# Patient Record
Sex: Female | Born: 1965 | Race: White | Hispanic: No | Marital: Married | Smoking: Never smoker
Health system: Southern US, Community
[De-identification: ages and names within clinical notes are randomized; demographics above are authoritative.]

## PROBLEM LIST (undated history)

## (undated) HISTORY — PX: NOSE SURGERY: SHX723

## (undated) HISTORY — PX: GANGLION CYST EXCISION: SHX1691

## (undated) HISTORY — PX: TUBAL LIGATION: SHX77

---

## 1999-01-18 ENCOUNTER — Inpatient Hospital Stay (HOSPITAL_COMMUNITY): Admission: AD | Admit: 1999-01-18 | Discharge: 1999-01-21 | Payer: Self-pay | Admitting: Obstetrics and Gynecology

## 1999-02-23 ENCOUNTER — Other Ambulatory Visit: Admission: RE | Admit: 1999-02-23 | Discharge: 1999-02-23 | Payer: Self-pay | Admitting: Obstetrics and Gynecology

## 2000-04-03 ENCOUNTER — Other Ambulatory Visit: Admission: RE | Admit: 2000-04-03 | Discharge: 2000-04-03 | Payer: Self-pay | Admitting: Obstetrics and Gynecology

## 2002-05-18 ENCOUNTER — Other Ambulatory Visit: Admission: RE | Admit: 2002-05-18 | Discharge: 2002-05-18 | Payer: Self-pay | Admitting: Obstetrics and Gynecology

## 2003-05-21 ENCOUNTER — Other Ambulatory Visit: Admission: RE | Admit: 2003-05-21 | Discharge: 2003-05-21 | Payer: Self-pay | Admitting: Obstetrics and Gynecology

## 2004-05-17 ENCOUNTER — Ambulatory Visit: Payer: Self-pay | Admitting: Family Medicine

## 2004-10-12 ENCOUNTER — Ambulatory Visit: Payer: Self-pay | Admitting: Family Medicine

## 2005-05-15 ENCOUNTER — Other Ambulatory Visit: Admission: RE | Admit: 2005-05-15 | Discharge: 2005-05-15 | Payer: Self-pay | Admitting: Obstetrics and Gynecology

## 2005-08-13 ENCOUNTER — Ambulatory Visit: Payer: Self-pay | Admitting: Family Medicine

## 2006-08-24 ENCOUNTER — Emergency Department: Payer: Self-pay | Admitting: Emergency Medicine

## 2006-08-28 ENCOUNTER — Ambulatory Visit: Payer: Self-pay | Admitting: Otolaryngology

## 2008-11-26 ENCOUNTER — Ambulatory Visit: Payer: Self-pay | Admitting: General Practice

## 2008-12-02 ENCOUNTER — Ambulatory Visit: Payer: Self-pay | Admitting: General Practice

## 2009-05-27 ENCOUNTER — Ambulatory Visit: Payer: Self-pay | Admitting: General Practice

## 2012-02-13 ENCOUNTER — Ambulatory Visit: Payer: Self-pay | Admitting: Internal Medicine

## 2014-07-13 ENCOUNTER — Ambulatory Visit: Payer: Self-pay | Admitting: Podiatry

## 2014-09-30 ENCOUNTER — Emergency Department: Payer: Self-pay | Admitting: Emergency Medicine

## 2017-05-27 ENCOUNTER — Emergency Department
Admission: EM | Admit: 2017-05-27 | Discharge: 2017-05-27 | Disposition: A | Discharge status: Home / Self Care | Payer: BLUE CROSS/BLUE SHIELD | Attending: Emergency Medicine | Admitting: Emergency Medicine

## 2017-05-27 ENCOUNTER — Other Ambulatory Visit: Payer: Self-pay

## 2017-05-27 DIAGNOSIS — Y929 Unspecified place or not applicable: Secondary | ICD-10-CM | POA: Insufficient documentation

## 2017-05-27 DIAGNOSIS — Y999 Unspecified external cause status: Secondary | ICD-10-CM | POA: Diagnosis not present

## 2017-05-27 DIAGNOSIS — S01112A Laceration without foreign body of left eyelid and periocular area, initial encounter: Secondary | ICD-10-CM | POA: Insufficient documentation

## 2017-05-27 DIAGNOSIS — W208XXA Other cause of strike by thrown, projected or falling object, initial encounter: Secondary | ICD-10-CM | POA: Insufficient documentation

## 2017-05-27 DIAGNOSIS — Y939 Activity, unspecified: Secondary | ICD-10-CM | POA: Diagnosis not present

## 2017-05-27 NOTE — ED Notes (Signed)

## 2017-05-27 NOTE — Discharge Instructions (Signed)
Keep wound area clean and dry. Allow steri-strips and glue to fall off and wear away.   Take ibuprofen for pain.

## 2017-05-27 NOTE — ED Triage Notes (Signed)
Pt states she was pulling up some fencing while wearing her glasses. States fencing hit pt face around L eye. Believes that glasses cut L eyelid. abrasion noted to L side of nose with straight cut inside eye. Laceration with controlled bleeding noted to L eyelid. Tetanus in 10 years.

## 2017-05-27 NOTE — ED Provider Notes (Signed)
[facility] Emergency Department Provider Note   ____________________________________________   I have reviewed the triage vital signs and the nursing notes.   HISTORY  Chief Complaint Laceration    HPI Krista NippleRobin A Cocking is a 51 y.o. female presents emergency department with small laceration in the left eyebrow she sustained when a piece of fencing kicked back hitting her eyeglasses causing a small laceration.  Patient managed hemorrhage until arriving in the emergency department.  Laceration and injury did not affect her vision. Patient denies fever, chills, headache, vision changes, chest pain, chest tightness, shortness of breath, abdominal pain, nausea and vomiting.  History reviewed. No pertinent past medical history.  There are no active problems to display for this patient.   Past Surgical History:  Procedure Laterality Date  . GANGLION CYST EXCISION    . NOSE SURGERY    . TUBAL LIGATION      Prior to Admission medications   Not on File    Allergies Patient has no known allergies.  History reviewed. No pertinent family history.  Social History Social History   Tobacco Use  . Smoking status: Not on file  Substance Use Topics  . Alcohol use: Not on file  . Drug use: Not on file    Review of Systems Constitutional: Negative for fever/chills Eyes: No visual changes.   Cardiovascular: Denies chest pain. Respiratory:  Denies shortness of breath. Skin: Negative for rash.  Small laceration in the left eyebrow. Neurological: Negative for headaches.  Negative focal weakness or numbness. Negative for loss of consciousness. Able to ambulate. ____________________________________________   PHYSICAL EXAM:  VITAL SIGNS: ED Triage Vitals  Enc Vitals Group     BP 05/27/17 1133 114/78     Pulse Rate 05/27/17 1133 79     Resp 05/27/17 1133 18     Temp 05/27/17 1133 97.9 F (36.6 C)     Temp Source 05/27/17 1133 Oral     SpO2 05/27/17  1133 100 %     Weight 05/27/17 1135 162 lb (73.5 kg)     Height 05/27/17 1135 5\' 6"  (1.676 m)     Head Circumference --      Peak Flow --      Pain Score 05/27/17 1134 5     Pain Loc --      Pain Edu? --      Excl. in GC? --     Constitutional: Alert and oriented. Well appearing and in no acute distress.  Eyes: Conjunctivae are normal. PERRL. EOMI Intact visual acuity Head: Normocephalic and atraumatic. Cardiovascular: Normal rate, regular rhythm.  Good peripheral circulation. Respiratory: Normal respiratory effort without tachypnea or retractions. Neurologic: Normal speech and language.  Skin:  Skin is warm, dry and intact. No rash noted. 1.25 cm superficial laceration along the left eyebrow. Psychiatric: Mood and affect are normal. Speech and behavior are normal. Patient exhibits appropriate insight and judgement.  ____________________________________________   LABS (all labs ordered are listed, but only abnormal results are displayed)  Labs Reviewed - No data to display ____________________________________________  EKG None ____________________________________________  RADIOLOGY None ____________________________________________   PROCEDURES  Procedure(s) performed: LACERATION REPAIR Performed by: Clois Comberraci M Dandy Lazaro Authorized by: Clois Comberraci M Jemmie Ledgerwood Consent: Verbal consent obtained. Risks and benefits: risks, benefits and alternatives were discussed Consent given by: patient Patient identity confirmed: provided demographic data Prepped and Draped in normal sterile fashion Wound explored  Laceration Location: left eybrow  Laceration Length: 1.25 cm  No Foreign Bodies seen or  palpated Irrigation method: syringe Amount of cleaning: standard  Skin closure: Dermabond with steri-strips    Critical Care performed: no ____________________________________________   INITIAL IMPRESSION / ASSESSMENT AND PLAN / ED COURSE  Pertinent labs & imaging results that were  available during my care of the patient were reviewed by me and considered in my medical decision making (see chart for details).   Patient presents to emergency department with facial laceration along the left eyebrow she sustained earlier today.  History, physical exam findings consistent with isolated superficial laceration along the left eyebrow without any other injuries.  Laceration required closure via Dermabond and Steri-Strips.  See procedure note above, no complications noted.  Advised to monitor wound area for signs of healing and if she noted any infections to follow-up with primary care or return to emergency department. Patient informed of clinical course, understand medical decision-making process, and agree with plan.  ____________________________________________   FINAL CLINICAL IMPRESSION(S) / ED DIAGNOSES  Final diagnoses:  Laceration of left eyebrow, initial encounter       NEW MEDICATIONS STARTED DURING THIS VISIT:  This SmartLink is deprecated. Use AVSMEDLIST instead to display the medication list for a patient.   Note:  This document was prepared using Dragon voice recognition software and may include unintentional dictation errors.    Clois ComberLittle, Larri Brewton M, PA-C 05/27/17 1657    Phineas SemenGoodman, Gray[place], MD 05/28/17 [phone]

## 2020-12-30 ENCOUNTER — Other Ambulatory Visit: Payer: Self-pay | Admitting: Surgery

## 2021-01-03 ENCOUNTER — Other Ambulatory Visit: Payer: Self-pay

## 2021-01-03 ENCOUNTER — Encounter
Admission: RE | Admit: 2021-01-03 | Discharge: 2021-01-03 | Disposition: A | Discharge status: Home / Self Care | Payer: BC Managed Care – PPO | Source: Ambulatory Visit | Attending: Surgery | Admitting: Surgery

## 2021-01-03 MED ORDER — ORAL CARE MOUTH RINSE: 15.0000 mL | Freq: Once | OROMUCOSAL | Status: AC

## 2021-01-03 MED ORDER — FAMOTIDINE 20 MG PO TABS: 20.0000 mg | ORAL_TABLET | Freq: Once | ORAL | Status: AC

## 2021-01-03 MED ORDER — CEFAZOLIN SODIUM-DEXTROSE 2-4 GM/100ML-% IV SOLN: 2.0000 g | INTRAVENOUS | Status: DC

## 2021-01-03 MED ORDER — CHLORHEXIDINE GLUCONATE 0.12 % MT SOLN: 15.0000 mL | Freq: Once | OROMUCOSAL | Status: AC

## 2021-01-03 MED ORDER — LACTATED RINGERS IV SOLN: INTRAVENOUS | Status: DC

## 2021-01-03 NOTE — Patient Instructions (Addendum)
Your procedure is scheduled on: 01/04/21 Report to DAY SURGERY DEPARTMENT LOCATED ON 2ND FLOOR MEDICAL MALL ENTRANCE. To find out your arrival time you may call [phone] between 1PM - 3PM on 01/03/21.   Remember: Instructions that are not followed completely may result in serious medical risk, up to and including death, or upon the discretion of your surgeon and anesthesiologist your surgery may need to be rescheduled.     _X__ 1. Do not eat food after midnight the night before your procedure.                 No gum chewing or hard candies. You may drink clear liquids up to 2 hours                 before you are scheduled to arrive for your surgery- DO not drink clear                 liquids within 2 hours of the start of your surgery.                 Clear Liquids include:  water, apple juice without pulp, clear carbohydrate                 drink such as Clearfast or Gatorade, Black Coffee or Tea (Do not add                 anything to coffee or tea). Diabetics water only  __X__2.  On the morning of surgery brush your teeth with toothpaste and water, you                 may rinse your mouth with mouthwash if you wish.  Do not swallow any              toothpaste of mouthwash.     _X__ 3.  No Alcohol for 24 hours before or after surgery.   _X__ 4.  Do Not Smoke or use e-cigarettes For 24 Hours Prior to Your Surgery.                 Do not use any chewable tobacco products for at least 6 hours prior to                 surgery.  ____  5.  Bring all medications with you on the day of surgery if instructed.   __X__  6.  Notify your doctor if there is any change in your medical condition      (cold, fever, infections).     Do not wear jewelry, make-up, hairpins, clips or nail polish. Do not wear lotions, powders, or perfumes. No Deodorant Do not shave 48 hours prior to surgery. Men may shave face and neck. Do not bring valuables to the hospital.    [facility] is not responsible for any  belongings or valuables.  Contacts, dentures/partials or body piercings may not be worn into surgery. Bring a case for your contacts, glasses or hearing aids, a denture cup will be supplied. Leave your suitcase in the car. After surgery it may be brought to your room. For patients admitted to the hospital, discharge time is determined by your treatment team.   Patients discharged the day of surgery will not be allowed to drive home.   Please read over the following fact sheets that you were given:     __X__ Take these medicines the morning of surgery with A SIP OF WATER:  1. celebrex  2.   3.   4.  5.  6.  ____ Fleet Enema (as directed)   ____ Use CHG Soap/SAGE wipes as directed  ____ Use inhalers on the day of surgery  ____ Stop metformin/Janumet/Farxiga 2 days prior to surgery    ____ Take 1/2 of usual insulin dose the night before surgery. No insulin the morning          of surgery.   ____ Stop Blood Thinners Coumadin/Plavix/Xarelto/Pleta/Pradaxa/Eliquis/Effient/Aspirin  on   Or contact your Surgeon, Cardiologist or Medical Doctor regarding  ability to stop your blood thinners  __X__ Stop Anti-inflammatories 7 days before surgery such as Advil, Ibuprofen, Motrin,  BC or Goodies Powder, Naprosyn, Naproxen, Aleve, Aspirin    __X__ Stop all herbal supplements, fish oil or vitamin E until after surgery.  Hold the collagen.  ____ Bring C-Pap to the hospital.    Wear something loose and comfortable

## 2021-01-04 ENCOUNTER — Ambulatory Visit: Payer: BC Managed Care – PPO | Admitting: Certified Registered"

## 2021-01-04 ENCOUNTER — Encounter: Payer: Self-pay | Admitting: Surgery

## 2021-01-04 ENCOUNTER — Encounter
Admission: RE | Disposition: A | Discharge status: Home / Self Care | Payer: Self-pay | Source: Home / Self Care | Attending: Surgery

## 2021-01-04 ENCOUNTER — Ambulatory Visit
Admission: RE | Admit: 2021-01-04 | Discharge: 2021-01-04 | Disposition: A | Discharge status: Home / Self Care | Payer: BC Managed Care – PPO | Attending: Surgery | Admitting: Surgery

## 2021-01-04 ENCOUNTER — Other Ambulatory Visit: Payer: Self-pay

## 2021-01-04 ENCOUNTER — Ambulatory Visit: Payer: BC Managed Care – PPO | Admitting: Urgent Care

## 2021-01-04 DIAGNOSIS — M7502 Adhesive capsulitis of left shoulder: Secondary | ICD-10-CM | POA: Diagnosis present

## 2021-01-04 HISTORY — PX: CLOSED MANIPULATION SHOULDER WITH STERIOD INJECTION: SHX5611

## 2021-01-04 SURGERY — CLOSED MANIPULATION SHOULDER WITH STEROID INJECTION
Anesthesia: General | Site: Shoulder | Laterality: Left

## 2021-01-04 MED ORDER — BUPIVACAINE HCL (PF) 0.25 % IJ SOLN
INTRAMUSCULAR | Status: AC
  Filled 2021-01-04: qty 30

## 2021-01-04 MED ORDER — ONDANSETRON HCL 4 MG/2ML IJ SOLN
INTRAMUSCULAR | Status: DC | PRN
  Administered 2021-01-04: 4 mg via INTRAVENOUS

## 2021-01-04 MED ORDER — ONDANSETRON HCL 4 MG PO TABS: 4.0000 mg | ORAL_TABLET | Freq: Four times a day (QID) | ORAL | Status: DC | PRN

## 2021-01-04 MED ORDER — CEFAZOLIN SODIUM-DEXTROSE 2-4 GM/100ML-% IV SOLN
INTRAVENOUS | Status: AC
  Filled 2021-01-04: qty 100

## 2021-01-04 MED ORDER — PROPOFOL 10 MG/ML IV BOLUS
INTRAVENOUS | Status: AC
  Filled 2021-01-04: qty 20

## 2021-01-04 MED ORDER — PENTAFLUOROPROP-TETRAFLUOROETH EX AERO
INHALATION_SPRAY | CUTANEOUS | Status: AC
  Filled 2021-01-04: qty 30

## 2021-01-04 MED ORDER — TRAMADOL HCL 50 MG PO TABS
ORAL_TABLET | ORAL | Status: AC
  Administered 2021-01-04: 50 mg via ORAL
  Filled 2021-01-04: qty 1

## 2021-01-04 MED ORDER — BUPIVACAINE HCL (PF) 0.25 % IJ SOLN
INTRAMUSCULAR | Status: DC | PRN
  Administered 2021-01-04: 9 mL

## 2021-01-04 MED ORDER — METOCLOPRAMIDE HCL 10 MG PO TABS: 5.0000 mg | ORAL_TABLET | Freq: Three times a day (TID) | ORAL | Status: DC | PRN

## 2021-01-04 MED ORDER — TRIAMCINOLONE ACETONIDE 40 MG/ML IJ SUSP
INTRAMUSCULAR | Status: DC | PRN
  Administered 2021-01-04: 10 mL via SUBCUTANEOUS

## 2021-01-04 MED ORDER — CHLORHEXIDINE GLUCONATE 0.12 % MT SOLN
OROMUCOSAL | Status: AC
  Administered 2021-01-04: 15 mL via OROMUCOSAL
  Filled 2021-01-04: qty 15

## 2021-01-04 MED ORDER — ONDANSETRON HCL 4 MG/2ML IJ SOLN: 4.0000 mg | Freq: Four times a day (QID) | INTRAMUSCULAR | Status: DC | PRN

## 2021-01-04 MED ORDER — FENTANYL CITRATE (PF) 100 MCG/2ML IJ SOLN
INTRAMUSCULAR | Status: DC | PRN
  Administered 2021-01-04 (×2): 50 ug via INTRAVENOUS

## 2021-01-04 MED ORDER — LIDOCAINE HCL (CARDIAC) PF 100 MG/5ML IV SOSY
PREFILLED_SYRINGE | INTRAVENOUS | Status: DC | PRN
  Administered 2021-01-04: 50 mg via INTRAVENOUS

## 2021-01-04 MED ORDER — FENTANYL CITRATE (PF) 100 MCG/2ML IJ SOLN
25.0000 ug | INTRAMUSCULAR | Status: DC | PRN
  Administered 2021-01-04: 25 ug via INTRAVENOUS

## 2021-01-04 MED ORDER — FAMOTIDINE 20 MG PO TABS
ORAL_TABLET | ORAL | Status: AC
  Administered 2021-01-04: 20 mg via ORAL
  Filled 2021-01-04: qty 1

## 2021-01-04 MED ORDER — TRAMADOL HCL 50 MG PO TABS: 50.0000 mg | ORAL_TABLET | Freq: Four times a day (QID) | ORAL | 0 refills | Status: AC | PRN

## 2021-01-04 MED ORDER — PROPOFOL 10 MG/ML IV BOLUS
INTRAVENOUS | Status: DC | PRN
  Administered 2021-01-04: 100 mg via INTRAVENOUS

## 2021-01-04 MED ORDER — FENTANYL CITRATE (PF) 100 MCG/2ML IJ SOLN
INTRAMUSCULAR | Status: AC
  Filled 2021-01-04: qty 2

## 2021-01-04 MED ORDER — FENTANYL CITRATE (PF) 100 MCG/2ML IJ SOLN
INTRAMUSCULAR | Status: AC
  Administered 2021-01-04: 25 ug via INTRAVENOUS
  Filled 2021-01-04: qty 2

## 2021-01-04 MED ORDER — MIDAZOLAM HCL 2 MG/2ML IJ SOLN
INTRAMUSCULAR | Status: AC
  Filled 2021-01-04: qty 2

## 2021-01-04 MED ORDER — POTASSIUM CHLORIDE IN NACL 20-0.9 MEQ/L-% IV SOLN
INTRAVENOUS | Status: DC
  Filled 2021-01-04 (×3): qty 1000

## 2021-01-04 MED ORDER — MIDAZOLAM HCL 2 MG/2ML IJ SOLN
INTRAMUSCULAR | Status: DC | PRN
  Administered 2021-01-04: 2 mg via INTRAVENOUS

## 2021-01-04 MED ORDER — METOCLOPRAMIDE HCL 5 MG/ML IJ SOLN: 5.0000 mg | Freq: Three times a day (TID) | INTRAMUSCULAR | Status: DC | PRN

## 2021-01-04 MED ORDER — TRIAMCINOLONE ACETONIDE 40 MG/ML IJ SUSP
INTRAMUSCULAR | Status: AC
  Filled 2021-01-04: qty 1

## 2021-01-04 MED ORDER — TRAMADOL HCL 50 MG PO TABS: 50.0000 mg | ORAL_TABLET | Freq: Four times a day (QID) | ORAL | Status: DC | PRN

## 2021-01-04 SURGICAL SUPPLY — 50 items
APL PRP STRL LF DISP 70% ISPRP (MISCELLANEOUS) ×1
BIT DRILL JUGRKNT W/NDL BIT2.9 (DRILL) IMPLANT
BLADE FULL RADIUS 3.5 (BLADE) ×2 IMPLANT
BNDG ADH 1X3 SHEER STRL LF (GAUZE/BANDAGES/DRESSINGS) ×2 IMPLANT
BNDG ADH THN 3X1 STRL LF (GAUZE/BANDAGES/DRESSINGS) ×1
BUR ACROMIONIZER 4.0 (BURR) ×2 IMPLANT
CANNULA SHAVER 8MMX76MM (CANNULA) ×2 IMPLANT
CHLORAPREP W/TINT 26 (MISCELLANEOUS) ×2 IMPLANT
COVER MAYO STAND REUSABLE (DRAPES) ×2 IMPLANT
COVER WAND RF STERILE (DRAPES) ×2 IMPLANT
DRAPE IMP U-DRAPE 54X76 (DRAPES) ×4 IMPLANT
DRILL JUGGERKNOT W/NDL BIT 2.9 (DRILL)
ELECT REM PT RETURN 9FT ADLT (ELECTROSURGICAL) ×2
ELECTRODE REM PT RTRN 9FT ADLT (ELECTROSURGICAL) ×1 IMPLANT
GAUZE SPONGE 4X4 12PLY STRL (GAUZE/BANDAGES/DRESSINGS) ×2 IMPLANT
GAUZE XEROFORM 1X8 LF (GAUZE/BANDAGES/DRESSINGS) ×2 IMPLANT
GLOVE SRG 8 PF TXTR STRL LF DI (GLOVE) ×1 IMPLANT
GLOVE SURG ENC MOIS LTX SZ7.5 (GLOVE) ×4 IMPLANT
GLOVE SURG ENC MOIS LTX SZ8 (GLOVE) ×4 IMPLANT
GLOVE SURG UNDER LTX SZ8 (GLOVE) ×2 IMPLANT
GLOVE SURG UNDER POLY LF SZ8 (GLOVE) ×2
GOWN STRL REUS W/ TWL LRG LVL3 (GOWN DISPOSABLE) ×1 IMPLANT
GOWN STRL REUS W/ TWL XL LVL3 (GOWN DISPOSABLE) ×1 IMPLANT
GOWN STRL REUS W/TWL LRG LVL3 (GOWN DISPOSABLE) ×2
GOWN STRL REUS W/TWL XL LVL3 (GOWN DISPOSABLE) ×2
GRASPER SUT 15 45D LOW PRO (SUTURE) IMPLANT
IMMOBILIZER SHDR MD LX WHT (SOFTGOODS) ×2 IMPLANT
IV LACTATED RINGER IRRG 3000ML (IV SOLUTION) ×2
IV LR IRRIG 3000ML ARTHROMATIC (IV SOLUTION) ×1 IMPLANT
KIT TURNOVER KIT A (KITS) ×2 IMPLANT
MANIFOLD NEPTUNE II (INSTRUMENTS) ×4 IMPLANT
MASK FACE SPIDER DISP (MASK) ×2 IMPLANT
MAT ABSORB  FLUID 56X50 GRAY (MISCELLANEOUS) ×2
MAT ABSORB FLUID 56X50 GRAY (MISCELLANEOUS) ×1 IMPLANT
NEEDLE HYPO 21X1.5 SAFETY (NEEDLE) ×2 IMPLANT
PACK ARTHROSCOPY SHOULDER (MISCELLANEOUS) ×2 IMPLANT
PAD ALCOHOL SWAB (MISCELLANEOUS) ×4 IMPLANT
PENCIL SMOKE EVACUATOR (MISCELLANEOUS) ×2 IMPLANT
SLING ARM LRG DEEP (SOFTGOODS) ×2 IMPLANT
SLING ARM M TX990204 (SOFTGOODS) ×2 IMPLANT
SLING ULTRA II LG (MISCELLANEOUS) ×2 IMPLANT
STAPLER SKIN PROX 35W (STAPLE) ×2 IMPLANT
STRAP SAFETY 5IN WIDE (MISCELLANEOUS) ×2 IMPLANT
SUT ETHIBOND 0 MO6 C/R (SUTURE) ×2 IMPLANT
SUT VIC AB 2-0 CT1 27 (SUTURE) ×4
SUT VIC AB 2-0 CT1 TAPERPNT 27 (SUTURE) ×2 IMPLANT
SYR 10ML LL (SYRINGE) ×2 IMPLANT
TAPE MICROFOAM 4IN (TAPE) ×2 IMPLANT
TUBING ARTHRO INFLOW-ONLY STRL (TUBING) ×2 IMPLANT
WAND WEREWOLF FLOW 90D (MISCELLANEOUS) ×2 IMPLANT

## 2021-01-04 NOTE — Anesthesia Preprocedure Evaluation (Addendum)
Anesthesia Evaluation  Patient identified by MRN, date of birth, ID band Patient awake    Reviewed: Allergy & Precautions, H&P , NPO status , Patient's Chart, lab work & pertinent test results, reviewed documented beta blocker date and time   History of Anesthesia Complications Negative for: history of anesthetic complications  Airway Mallampati: I  TM Distance: >3 FB Neck ROM: full    Dental no notable dental hx.    Pulmonary neg pulmonary ROS,    Pulmonary exam normal breath sounds clear to auscultation       Cardiovascular Exercise Tolerance: Good negative cardio ROS Normal cardiovascular exam Rhythm:regular Rate:Normal     Neuro/Psych negative neurological ROS  negative psych ROS   GI/Hepatic negative GI ROS, Neg liver ROS,   Endo/Other  negative endocrine ROS  Renal/GU negative Renal ROS  negative genitourinary   Musculoskeletal   Abdominal   Peds  Hematology negative hematology ROS (+)   Anesthesia Other Findings History reviewed. No pertinent past medical history.   Reproductive/Obstetrics negative OB ROS                             Anesthesia Physical Anesthesia Plan  ASA: 1  Anesthesia Plan: General   Post-op Pain Management:    Induction: Intravenous  PONV Risk Score and Plan: 3 and TIVA and Propofol infusion  Airway Management Planned: Natural Airway  Additional Equipment:   Intra-op Plan:   Post-operative Plan:   Informed Consent: I have reviewed the patients History and Physical, chart, labs and discussed the procedure including the risks, benefits and alternatives for the proposed anesthesia with the patient or authorized representative who has indicated his/her understanding and acceptance.     Dental Advisory Given  Plan Discussed with: Anesthesiologist, CRNA and Surgeon  Anesthesia Plan Comments: (Plan is just to proceed with propofol general to  start out for the shoulder manipulation.  If Dr. Joice Lofts needs to do further procedures including shoulder arthroscopy then we will proceed with GA and ETT.  Patient was made aware of this and knows that we would not wake her up prior to placing the breathing tube.  We also discussed the possibility of a post-op interscalene block for pain relief in the PACU if Dr. Joice Lofts needs to do the athroscopy.  Patient was agreeable to the plan.  Discussed risks and benefits of everything.)       Anesthesia Quick Evaluation

## 2021-01-04 NOTE — Op Note (Signed)
01/04/2021  10:26 AM  Patient:   Krista [name]  Pre-Op Diagnosis:   Primary adhesive capsulitis, left shoulder.  Post-Op Diagnosis:   Same  Procedure:   Manipulation under anesthesia with steroid injection, left shoulder.  Surgeon:   Maryagnes Amos, MD  Assistant:   None  Anesthesia:   IV sedation  Findings:   As above. Prior to manipulation, the left shoulder could be forward flexed to 150 and abducted to 145. At 90 of abduction, the shoulder could be externally rotated to 70 and internally rotated to 55. Following manipulation, the shoulder could be forward flexed to 180, abducted to 175 and, at 90 of abduction, externally rotated to 95 and internally rotated to 75.  Complications:   None  EBL:   0 cc  Fluids:   100 cc crystalloid  TT:   None  Drains:   None  Closure:   None  Brief Clinical Note:   The patient is a 55 year old female with an 8 to 22-month history of left shoulder pain and stiffness which developed without any specific cause or injury. Despite medications, activity modification, and injection, and physical therapy, the patient continues to have difficulty regaining shoulder range of motion. The patient's history and examination are consistent with adhesive capsulitis. The patient presents at this time for a manipulation under anesthesia with steroid injection of the left shoulder.  Procedure:   The patient underwent placement of an interscalene block in the preoperative holding area before being brought into the operating room and lain in the supine position. After adequate IV sedation was achieved, a timeout was performed to verify the correct surgical site. The left shoulder was gently manipulated in both abduction and external rotation, as well as adduction and internal rotation. Several palpable and audible pops were heard as the scar tissue released, permitting full range of motion of the shoulder. The glenohumeral joint was injected sterilely  using 1 cc of Kenalog-40 and 9 cc of 0.25% Sensorcaine with epinephrine before the patient was placed into a sling. The patient was then awakened and returned to the recovery room in satisfactory condition after tolerating the procedure well.

## 2021-01-04 NOTE — Discharge Instructions (Addendum)
Orthopedic discharge instructions: May shower immediately.  Use sling as necessary for comfort. Apply ice frequently to shoulder. Take Celebrex 200 mg daily OR ibuprofen 600-800 mg TID with meals for 7-10 days, then as necessary. Take Tramadol as prescribed when needed.  May supplement with ES Tylenol if necessary. Start physical therapy on Friday as scheduled. Follow-up in 10-14 days or as scheduled.   AMBULATORY SURGERY  DISCHARGE INSTRUCTIONS   The drugs that you were given will stay in your system until tomorrow so for the next 24 hours you should not:  Drive an automobile Make any legal decisions Drink any alcoholic beverage   You may resume regular meals tomorrow.  Today it is better to start with liquids and gradually work up to solid foods.  You may eat anything you prefer, but it is better to start with liquids, then soup and crackers, and gradually work up to solid foods.   Please notify your doctor immediately if you have any unusual bleeding, trouble breathing, redness and pain at the surgery site, drainage, fever, or pain not relieved by medication.    Additional Instructions:        Please contact your physician with any problems or Same Day Surgery at [phone], Monday through Friday 6 am to 4 pm, or [place] at [facility] number at [phone].

## 2021-01-04 NOTE — Transfer of Care (Signed)
Immediate Anesthesia Transfer of Care Note  Patient: Krista [name]  Procedure(s) Performed: CLOSED MANIPULATION SHOULDER UNDER ANESTHESIA WITH STEROID INJECTION AND POSSIBLE ARTHROSCOPIC DEBRIDEMENT. (Left: Shoulder) ARTHROSCOPY SHOULDER (Left: Shoulder)  Patient Location: PACU  Anesthesia Type:General  Level of Consciousness: awake and drowsy  Airway & Oxygen Therapy: Patient Spontanous Breathing  Post-op Assessment: Report given to RN and Post -op Vital signs reviewed and stable  Post vital signs: stable  Last Vitals:  Vitals Value Taken Time  BP 108/74 01/04/21 1025  Temp    Pulse 62 01/04/21 1027  Resp 14 01/04/21 1027  SpO2 100 % 01/04/21 1027  Vitals shown include unvalidated device data.  Last Pain:  Vitals:   01/04/21 0906  TempSrc: Oral  PainSc: 0-No pain         Complications: No notable events documented.

## 2021-01-04 NOTE — H&P (Signed)
History of Present Illness: Krista [name] is a 55 y.o. female who presents today for repeat evaluation of ongoing left shoulder pain. The patient was first evaluated in January and diagnosed with left frozen shoulder, she underwent a left subacromial steroid injection and was also given a meloxicam prescription and formal physical therapy. The patient was also doing exercises at home and continue with chiropractic care as well. The patient continues to report that her pain is well controlled however she does still have difficulty reaching above the head and out to the side. The patient is no longer taking any meloxicam. She denies any falls or trauma affecting left shoulder. She does report increased pain at the extremes of motion especially trying to reach above her head or out to the side. The patient has difficulty reaching behind her back as well. She denies any catching or locking symptoms. Denies any numbness or tingling to the left upper extremity. She reports a pain score today is 0 out of 10. She denies any personal history of heart attack, stroke, asthma or COPD. No personal history of blood clots.  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   Hemorrhoids   Nasal fracture   Varicosities of leg   Past Surgical History:  CATARACT EXTRACTION   NEUROPLASTY VOCAL CORD   TUBAL LIGATION   Past Family History:  Hyperlipidemia (Elevated cholesterol) Mother   Breast cancer Mother   Coronary Artery Disease (Blocked arteries around heart) Father   Ulcerative colitis Brother   Myocardial Infarction (Heart attack) Other   Prostate cancer Other   Medications:  argin/glut/CaHMB/collag/mv-min (JUVEN, WITH COLLAGEN, ORAL)   IBUPROFEN ORAL Take by mouth as needed   Allergies: No Known Allergies   Review of Systems:  A comprehensive 14 point ROS was performed, reviewed by me today, and the pertinent orthopaedic findings are documented in the HPI.  Physical  Exam: BP 118/80 (BP Location: Left upper arm, Patient Position: Sitting, BP Cuff Size: Adult)  Ht 167.6 cm (5\' 6" )  Wt 75.1 kg (165 lb 9.6 oz)  BMI 26.73 kg/m  General/Constitutional: The patient appears to be well-nourished, well-developed, and in no acute distress. Neuro/Psych: Normal mood and affect, oriented to person, place and time. Eyes: Non-icteric. Pupils are equal, round, and reactive to light, and exhibit synchronous movement. ENT: Unremarkable. Lymphatic: No palpable adenopathy. Respiratory: Lungs clear to auscultation, Normal chest excursion, No wheezes and Non-labored breathing Cardiovascular: Regular rate and rhythm. No murmurs. and No edema, swelling or tenderness, except as noted in detailed exam. Integumentary: No impressive skin lesions present, except as noted in detailed exam. Musculoskeletal: Unremarkable, except as noted in detailed exam.  General: Well developed, well nourished 55 y.o. female in no apparent distress. Normal affect. Normal communication. Patient answers questions appropriately. The patient has a normal gait. There is no antalgic component. There is no hip lurch.   Left Upper Extremity: Examination of the left shoulder and arm showed no bony abnormality or edema. The patient is able to actively forward flex 140 degrees, active abduction 120 degrees. Passively she can achieve 155 degrees, passive abduction 140 degrees. With the left shoulder abduction 90 degrees, she can tolerate external rotation 75 degrees, internal rotation 60 degrees. Pain at the extreme of motion. The patient has a negative Hawkins test and a negative impingement test. The patient has a negative drop arm test. The patient is non-tender along the deltoid muscle. There is mild subacromial space tenderness with no AC joint tenderness.  The patient has no instability of the shoulder with anterior-posterior motion. There is a negative sulcus sign. The rotator cuff muscle strength is 5/5 with  supraspinatus, 5/5 with internal rotation, and 5/5 with external rotation. There is no crepitus with range of motion activities.   Neurological: The patient has sensation that is intact to light touch and pinprick bilaterally. The patient has normal grip strength. The patient has full biceps, wrist extension, grip, and interosseous strength. The patient has 2 + DTRs bilaterally.  Vascular: The patient has less than 2 second capillary refill. The patient has normal ulnar and radial pulses. The patient has normal warmth to touch.   Imaging: True AP, Y-scapular, and axillary views of the left shoulder a were obtained at a previous visit. These films demonstrate no evidence for fractures, lytic lesions, or significant degenerative changes. The subacromial space is well-maintained. There is no subacromial or infra-clavicular spurring. She demonstrates a Type I acromion.  Impression: Rotator cuff tendonitis, left. Adhesive capsulitis of left shoulder.  Plan:  1. Treatment options were discussed today with the patient. 2. I believe that the patient is still suffering from underlying adhesive capsulitis. 3. Discussed several treatment options with the patient including continued conservative treatment with home exercises and reevaluate motion in the future. Did discuss a closed manipulation under anesthesia versus undergoing an MRI scan. 4. The patient would like to discuss continued treatment options with her family, she states that she will contact me next week. 5. The risk and benefits of a closed manipulation under anesthesia were discussed in detail with the patient at today's visit. If she does proceed with surgery this document will serve as a surgical history and physical for the patient. 5. They can call the clinic they have any questions, new symptoms develop or symptoms worsen.  The procedure was discussed with the patient, as were the potential risks (including bleeding, infection, nerve  and/or blood vessel injury, persistent or recurrent pain, failure of the manipulation, possibility of converting to arthroscopic procedure, progression of arthritis, need for further surgery, blood clots, strokes, heart attacks and/or arhythmias, pneumonia, etc.) and benefits. The patient states her understanding and wishes to proceed.   H&P reviewed and patient re-examined. No changes.

## 2021-01-05 NOTE — Anesthesia Postprocedure Evaluation (Signed)
Anesthesia Post Note  Patient: Krista [name]  Procedure(s) Performed: CLOSED MANIPULATION SHOULDER UNDER ANESTHESIA WITH STEROID INJECTION AND POSSIBLE ARTHROSCOPIC DEBRIDEMENT. (Left: Shoulder)  Patient location during evaluation: PACU Anesthesia Type: General Level of consciousness: awake and alert Pain management: pain level controlled Vital Signs Assessment: post-procedure vital signs reviewed and stable Respiratory status: spontaneous breathing, nonlabored ventilation, respiratory function stable and patient connected to nasal cannula oxygen Cardiovascular status: blood pressure returned to baseline and stable Postop Assessment: no apparent nausea or vomiting Anesthetic complications: no   No notable events documented.   Last Vitals:  Vitals:   01/04/21 1115 01/04/21 1140  BP: 121/73 126/74  Pulse: (!) 49 (!) 50  Resp: (!) 9 16  Temp: 36.4 C 36.6 C  SpO2: 98% 100%    Last Pain:  Vitals:   01/04/21 1140  TempSrc: Temporal  PainSc: 3                  Lenard Simmer

## 2021-04-20 ENCOUNTER — Ambulatory Visit: Payer: BC Managed Care – PPO | Admitting: Family Medicine

## 2021-05-05 NOTE — Progress Notes (Signed)
[name] [address] [place] [address] Phone: [phone] Subjective:   [name], am serving as a scribe for Dr. Antoine Primas. This visit occurred during the SARS-CoV-2 public health emergency.  Safety protocols were in place, including screening questions prior to the visit, additional usage of staff PPE, and extensive cleaning of exam room while observing appropriate contact time as indicated for disinfecting solutions.   I'm seeing this patient by the request  of:  Marguarite Arbour, MD  CC: Back pain   [id number]  Krista [name] is a 55 y.o. female coming in with complaint of back pain. Pain lower R lumbar spine that radiates into the lateral aspect of right hip to the R knee. Has hard time sleeping, sit to stand and pain with first few steps. Pain is achy in character. Tried celebrex for pain relief which did not help.     Reviewing patient's chart patient did have close manipulation of the shoulder in June of this year.  Has been in rehab for the left shoulder pain.    No past medical history on file. Past Surgical History:  Procedure Laterality Date   CLOSED MANIPULATION SHOULDER WITH STERIOD INJECTION Left 01/04/2021   Procedure: CLOSED MANIPULATION SHOULDER UNDER ANESTHESIA WITH STEROID INJECTION AND POSSIBLE ARTHROSCOPIC DEBRIDEMENT.;  Surgeon: Christena Flake, MD;  Location: [facility];  Service: Orthopedics;  Laterality: Left;   GANGLION CYST EXCISION     NOSE SURGERY     TUBAL LIGATION     Social History   Socioeconomic History   Marital status: Married    Spouse name: Not on file   Number of children: Not on file   Years of education: Not on file   Highest education level: Not on file  Occupational History   Not on file  Tobacco Use   Smoking status: Never   Smokeless tobacco: Never  Vaping Use   Vaping Use: Never used  Substance and Sexual Activity   Alcohol use: Never   Drug use: Never   Sexual activity:  Not on file  Other Topics Concern   Not on file  Social History Narrative   Not on file   Social Determinants of Health   Financial Resource Strain: Not on file  Food Insecurity: Not on file  Transportation Needs: Not on file  Physical Activity: Not on file  Stress: Not on file  Social Connections: Not on file   No Known Allergies No family history on file.     Current Outpatient Medications (Analgesics):    celecoxib (CELEBREX) 200 MG capsule, Take 200 mg by mouth 2 (two) times daily.   traMADol (ULTRAM) 50 MG tablet, Take 1 tablet (50 mg total) by mouth every 6 (six) hours as needed.   Current Outpatient Medications (Other):    COLLAGEN PO, Take 1 Scoop by mouth daily.   methocarbamol (ROBAXIN) 750 MG tablet, Take 750 mg by mouth every evening.   Reviewed prior external information including notes and imaging from  primary care provider As well as notes that were available from care everywhere and other healthcare systems.  Past medical history, social, surgical and family history all reviewed in electronic medical record.  No pertanent information unless stated regarding to the chief complaint.   Review of Systems:  No headache, visual changes, nausea, vomiting, diarrhea, constipation, dizziness, abdominal pain, skin rash, fevers, chills, night sweats, weight loss, swollen lymph nodes, body aches, joint swelling, chest pain, shortness of  breath, mood changes. POSITIVE muscle aches  Objective  Blood pressure 110/80, pulse 67, height 5' 6.5" (1.689 m), weight 176 lb (79.8 kg), SpO2 99 %.   General: No apparent distress alert and oriented x3 mood and affect normal, dressed appropriately.  HEENT: Pupils equal, extraocular movements intact  Respiratory: Patient's speak in full sentences and does not appear short of breath  Cardiovascular: No lower extremity edema, non tender, no erythema  Gait antalgic gait MSK: Low back exam shows mild loss of lordosis with some mild  pain over the right greater trochanteric area Patient does have some tightness with FABER test.  Minimal pain over the sacroiliac joint.  Negative straight leg test noted today. Patient does have what appears to be some arthritic changes of the right knee noted.   Procedure: Real-time Ultrasound Guided Injection of right greater trochanteric bursitis secondary to patient's body habitus Device: GE Logiq Q7 Ultrasound guided injection is preferred based studies that show increased duration, increased effect, greater accuracy, decreased procedural pain, increased response rate, and decreased cost with ultrasound guided versus blind injection.  Verbal informed consent obtained.  Time-out conducted.  Noted no overlying erythema, induration, or other signs of local infection.  Skin prepped in a sterile fashion.  Local anesthesia: Topical Ethyl chloride.  With sterile technique and under real time ultrasound guidance:  Greater trochanteric area was visualized and patient's bursa was noted. A 22-gauge 3 inch needle was inserted and 4 cc of 0.5% Marcaine and 1 cc of Kenalog 40 mg/dL was injected. Pictures taken Completed without difficulty  Pain immediately resolved suggesting accurate placement of the medication.  Advised to call if fevers/chills, erythema, induration, drainage, or persistent bleeding.  Impression: Technically successful ultrasound guided injection.     Impression and Recommendations:     The above documentation has been reviewed and is accurate and complete [name], DO

## 2021-05-08 ENCOUNTER — Other Ambulatory Visit: Payer: Self-pay

## 2021-05-08 ENCOUNTER — Ambulatory Visit: Payer: Self-pay

## 2021-05-08 ENCOUNTER — Ambulatory Visit: Payer: BC Managed Care – PPO | Admitting: Family Medicine

## 2021-05-08 ENCOUNTER — Encounter: Payer: Self-pay | Admitting: Family Medicine

## 2021-05-08 ENCOUNTER — Ambulatory Visit (INDEPENDENT_AMBULATORY_CARE_PROVIDER_SITE_OTHER): Payer: BC Managed Care – PPO

## 2021-05-08 VITALS — BP 110/80 | HR 67 | Ht 66.5 in | Wt 176.0 lb

## 2021-05-08 DIAGNOSIS — M545 Low back pain, unspecified: Secondary | ICD-10-CM | POA: Diagnosis not present

## 2021-05-08 DIAGNOSIS — M25552 Pain in left hip: Secondary | ICD-10-CM

## 2021-05-08 DIAGNOSIS — M25551 Pain in right hip: Secondary | ICD-10-CM

## 2021-05-08 DIAGNOSIS — M7061 Trochanteric bursitis, right hip: Secondary | ICD-10-CM | POA: Diagnosis not present

## 2021-05-08 DIAGNOSIS — M25561 Pain in right knee: Secondary | ICD-10-CM

## 2021-05-08 NOTE — Assessment & Plan Note (Signed)
Patient given injection today.  Home exercises given and patient work with Event organiser.  Discussed icing regimen.  Discussed topical anti-inflammatories.  Discussed the potential for increasing Celebrex if necessary as well.  Follow-up with me again in 4 to 8 weeks.  Worsening pain we will consider the possibility of lumbar radiculopathy but I think it is highly unlikely at this time

## 2021-05-08 NOTE — Patient Instructions (Signed)
Xray on way out Exercises 3x a week Injected R  GT today Ice at end of day for 20 min See me again in 6

## 2021-05-28 ENCOUNTER — Encounter: Payer: Self-pay | Admitting: Family Medicine

## 2021-05-28 DIAGNOSIS — R635 Abnormal weight gain: Secondary | ICD-10-CM

## 2021-06-16 NOTE — Progress Notes (Signed)
[name] [address] [place] [address] Phone: [phone] Subjective:   Krista [name], am serving as a scribe for Dr. Antoine Primas. This visit occurred during the SARS-CoV-2 public health emergency.  Safety protocols were in place, including screening questions prior to the visit, additional usage of staff PPE, and extensive cleaning of exam room while observing appropriate contact time as indicated for disinfecting solutions.   I'm seeing this patient by the request  of:  Marguarite Arbour, MD  CC: Hip and back pain follow-up  Krista [name]  05/08/2021 Patient given injection today.  Home exercises given and patient work with Event organiser.  Discussed icing regimen.  Discussed topical anti-inflammatories.  Discussed the potential for increasing Celebrex if necessary as well.  Follow-up with me again in 4 to 8 weeks.  Worsening pain we will consider the possibility of lumbar radiculopathy but I think it is highly unlikely at this time  Updated 06/19/2021 Krista [name] is a 55 y.o. female coming in with complaint of back pain. Pain has decreased. Hip pain has gone down significantly. Knee pain is sporadic when working a lot is when it increases. Ibuprofen helps when flare up. No new complaints.  Xray IMPRESSION: Moderate degenerative disc disease L3-L5.  IMPRESSION: Mild asymmetric right hip degenerative arthritis.  Knee xray (-)      No past medical history on file. Past Surgical History:  Procedure Laterality Date   CLOSED MANIPULATION SHOULDER WITH STERIOD INJECTION Left 01/04/2021   Procedure: CLOSED MANIPULATION SHOULDER UNDER ANESTHESIA WITH STEROID INJECTION AND POSSIBLE ARTHROSCOPIC DEBRIDEMENT.;  Surgeon: Christena Flake, MD;  Location: [facility];  Service: Orthopedics;  Laterality: Left;   GANGLION CYST EXCISION     NOSE SURGERY     TUBAL LIGATION     Social History   Socioeconomic History   Marital status: Married     Spouse name: Not on file   Number of children: Not on file   Years of education: Not on file   Highest education level: Not on file  Occupational History   Not on file  Tobacco Use   Smoking status: Never   Smokeless tobacco: Never  Vaping Use   Vaping Use: Never used  Substance and Sexual Activity   Alcohol use: Never   Drug use: Never   Sexual activity: Not on file  Other Topics Concern   Not on file  Social History Narrative   Not on file   Social Determinants of Health   Financial Resource Strain: Not on file  Food Insecurity: Not on file  Transportation Needs: Not on file  Physical Activity: Not on file  Stress: Not on file  Social Connections: Not on file   No Known Allergies No family history on file.     Current Outpatient Medications (Analgesics):    celecoxib (CELEBREX) 200 MG capsule, Take 200 mg by mouth 2 (two) times daily.   traMADol (ULTRAM) 50 MG tablet, Take 1 tablet (50 mg total) by mouth every 6 (six) hours as needed.   Current Outpatient Medications (Other):    COLLAGEN PO, Take 1 Scoop by mouth daily.   methocarbamol (ROBAXIN) 750 MG tablet, Take 750 mg by mouth every evening.   Reviewed prior external information including notes and imaging from  primary care provider As well as notes that were available from care everywhere and other healthcare systems.  Past medical history, social, surgical and family history all reviewed in electronic medical record.  No pertanent information unless stated regarding to the chief complaint.   Review of Systems:  No headache, visual changes, nausea, vomiting, diarrhea, constipation, dizziness, abdominal pain, skin rash, fevers, chills, night sweats, weight loss, swollen lymph nodes, body aches, joint swelling, chest pain, shortness of breath, mood changes. POSITIVE muscle aches  Objective  Blood pressure 118/78, pulse 91, height 5\' 6"  (1.676 m), weight 175 lb (79.4 kg), SpO2 98 %.   General: No apparent  distress alert and oriented x3 mood and affect normal, dressed appropriately.  HEENT: Pupils equal, extraocular movements intact  Respiratory: Patient's speak in full sentences and does not appear short of breath  Cardiovascular: No lower extremity edema, non tender, no erythema  Gait mild antalgic Back exam does have some mild loss of lordosis.  Some tenderness to palpation of the paraspinal musculature.  No tender on the greater trochanteric area.    Impression and Recommendations:     The above documentation has been reviewed and is accurate and complete , DO

## 2021-06-19 ENCOUNTER — Ambulatory Visit: Payer: BC Managed Care – PPO | Admitting: Family Medicine

## 2021-06-19 ENCOUNTER — Other Ambulatory Visit: Payer: Self-pay

## 2021-06-19 DIAGNOSIS — M5136 Other intervertebral disc degeneration, lumbar region: Secondary | ICD-10-CM | POA: Diagnosis not present

## 2021-06-19 DIAGNOSIS — M7061 Trochanteric bursitis, right hip: Secondary | ICD-10-CM | POA: Diagnosis not present

## 2021-06-19 NOTE — Assessment & Plan Note (Addendum)
Patient is improved at this time.  Discussed posture and ergonomics, discussed which activities to do which wants to avoid.  Increase activity slowly.  Follow-up with me again in 4 to 8 weeks.  Or if doing well can follow-up as needed.

## 2021-06-19 NOTE — Patient Instructions (Signed)
Good to see you! Glad you're doing better Stay active See you again in 2 months or when you need me

## 2021-06-19 NOTE — Assessment & Plan Note (Signed)
Patient does have degenerative disc disease of the lumbar spine.  We will monitor.  Worsening pain will need to consider the possibility of gabapentin and further advanced imaging.

## 2021-08-17 NOTE — Progress Notes (Deleted)
[facility] [address] [place] Phone: [phone] Subjective:    I'm seeing this patient by the request  of:  Idelle Crouch, MD  CC:   [id number]  06/19/2021 Patient is improved at this time.  Discussed posture and ergonomics, discussed which activities to do which wants to avoid.  Increase activity slowly.  Follow-up with me again in 4 to 8 weeks.  Or if doing well can follow-up as needed.  Update 08/22/2021 Krista [name] is a 56 y.o. female coming in with complaint of R hip and LBP. Patient states      No past medical history on file. Past Surgical History:  Procedure Laterality Date   CLOSED MANIPULATION SHOULDER WITH STERIOD INJECTION Left 01/04/2021   Procedure: CLOSED MANIPULATION SHOULDER UNDER ANESTHESIA WITH STEROID INJECTION AND POSSIBLE ARTHROSCOPIC DEBRIDEMENT.;  Surgeon: Corky Mull, MD;  Location: [facility];  Service: Orthopedics;  Laterality: Left;   GANGLION CYST EXCISION     NOSE SURGERY     TUBAL LIGATION     Social History   Socioeconomic History   Marital status: Married    Spouse name: Not on file   Number of children: Not on file   Years of education: Not on file   Highest education level: Not on file  Occupational History   Not on file  Tobacco Use   Smoking status: Never   Smokeless tobacco: Never  Vaping Use   Vaping Use: Never used  Substance and Sexual Activity   Alcohol use: Never   Drug use: Never   Sexual activity: Not on file  Other Topics Concern   Not on file  Social History Narrative   Not on file   Social Determinants of Health   Financial Resource Strain: Not on file  Food Insecurity: Not on file  Transportation Needs: Not on file  Physical Activity: Not on file  Stress: Not on file  Social Connections: Not on file   No Known Allergies No family history on file.     Current Outpatient Medications (Analgesics):    celecoxib (CELEBREX) 200 MG capsule, Take  200 mg by mouth 2 (two) times daily.   traMADol (ULTRAM) 50 MG tablet, Take 1 tablet (50 mg total) by mouth every 6 (six) hours as needed.   Current Outpatient Medications (Other):    COLLAGEN PO, Take 1 Scoop by mouth daily.   methocarbamol (ROBAXIN) 750 MG tablet, Take 750 mg by mouth every evening.   Reviewed prior external information including notes and imaging from  primary care provider As well as notes that were available from care everywhere and other healthcare systems.  Past medical history, social, surgical and family history all reviewed in electronic medical record.  No pertanent information unless stated regarding to the chief complaint.   Review of Systems:  No headache, visual changes, nausea, vomiting, diarrhea, constipation, dizziness, abdominal pain, skin rash, fevers, chills, night sweats, weight loss, swollen lymph nodes, body aches, joint swelling, chest pain, shortness of breath, mood changes. POSITIVE muscle aches  Objective  There were no vitals taken for this visit.   General: No apparent distress alert and oriented x3 mood and affect normal, dressed appropriately.  HEENT: Pupils equal, extraocular movements intact  Respiratory: Patient's speak in full sentences and does not appear short of breath  Cardiovascular: No lower extremity edema, non tender, no erythema  Gait normal with good balance and coordination.  MSK:  Non tender with full range  of motion and good stability and symmetric strength and tone of shoulders, elbows, wrist, hip, knee and ankles bilaterally.     Impression and Recommendations:     The above documentation has been reviewed and is accurate and complete Krista [name]

## 2021-08-22 ENCOUNTER — Ambulatory Visit: Payer: BC Managed Care – PPO | Admitting: Family Medicine

## 2021-12-27 NOTE — Progress Notes (Signed)
[facility] [place] [address] [place] Phone: [phone] Subjective:   IVilma Meckel, am serving as a scribe for Dr. Hulan Saas.  I'm seeing this patient by the request  of:  Idelle Crouch, MD  CC: Left foot pain  [id number]  Tameyah A Stoos is a 56 y.o. female coming in with complaint of joint pain. Last seen for hip and back pain in December 2022. Patient states hip is doing a lot better since injection. Left foot pain tender to the touch some times when stepping feels a twinge. Right hip pain has radiated into groin but doing well.    No past medical history on file. Past Surgical History:  Procedure Laterality Date   CLOSED MANIPULATION SHOULDER WITH STERIOD INJECTION Left 01/04/2021   Procedure: CLOSED MANIPULATION SHOULDER UNDER ANESTHESIA WITH STEROID INJECTION AND POSSIBLE ARTHROSCOPIC DEBRIDEMENT.;  Surgeon: Corky Mull, MD;  Location: [facility];  Service: Orthopedics;  Laterality: Left;   GANGLION CYST EXCISION     NOSE SURGERY     TUBAL LIGATION     Social History   Socioeconomic History   Marital status: Married    Spouse name: Not on file   Number of children: Not on file   Years of education: Not on file   Highest education level: Not on file  Occupational History   Not on file  Tobacco Use   Smoking status: Never   Smokeless tobacco: Never  Vaping Use   Vaping Use: Never used  Substance and Sexual Activity   Alcohol use: Never   Drug use: Never   Sexual activity: Not on file  Other Topics Concern   Not on file  Social History Narrative   Not on file   Social Determinants of Health   Financial Resource Strain: Not on file  Food Insecurity: Not on file  Transportation Needs: Not on file  Physical Activity: Not on file  Stress: Not on file  Social Connections: Not on file   No Known Allergies No family history on file.     Current Outpatient Medications (Analgesics):    celecoxib  (CELEBREX) 200 MG capsule, Take 200 mg by mouth 2 (two) times daily.   traMADol (ULTRAM) 50 MG tablet, Take 1 tablet (50 mg total) by mouth every 6 (six) hours as needed.   Current Outpatient Medications (Other):    COLLAGEN PO, Take 1 Scoop by mouth daily.   methocarbamol (ROBAXIN) 750 MG tablet, Take 750 mg by mouth every evening.   Reviewed prior external information including notes and imaging from  primary care provider As well as notes that were available from care everywhere and other healthcare systems.  Past medical history, social, surgical and family history all reviewed in electronic medical record.  No pertanent information unless stated regarding to the chief complaint.   Review of Systems:  No headache, visual changes, nausea, vomiting, diarrhea, constipation, dizziness, abdominal pain, skin rash, fevers, chills, night sweats, weight loss, swollen lymph nodes, body aches, joint swelling, chest pain, shortness of breath, mood changes. POSITIVE muscle aches  Objective  Blood pressure 106/64, pulse 84, height 5\' 6"  (1.676 m), weight 171 lb (77.6 kg), SpO2 96 %.   General: No apparent distress alert and oriented x3 mood and affect normal, dressed appropriately.  HEENT: Pupils equal, extraocular movements intact  Respiratory: Patient's speak in full sentences and does not appear short of breath  Cardiovascular: No lower extremity edema, non tender, no erythema  Foot exam shows a very mild effusion noted of the midfoot.  Plantar midfoot noted.  Patient has postsurgical changes of the first ray.  Tender to palpation over the third and fourth metatarsals proximally.  Limited muscular skeletal ultrasound was performed and interpreted by Hulan Saas, M  Limited ultrasound of the area where patient is most tender shows the patient does have a joint effusion with hypoechoic changes noted in the midfoot around the fourth metatarsal.  No true cortical irregularity but does have  increasing Doppler flow to the distal bone in this area. Impression: Stress reaction versus sprain of the midfoot    Impression and Recommendations:

## 2021-12-28 ENCOUNTER — Ambulatory Visit: Payer: BC Managed Care – PPO | Admitting: Family Medicine

## 2021-12-29 ENCOUNTER — Ambulatory Visit: Payer: Self-pay

## 2021-12-29 ENCOUNTER — Ambulatory Visit: Payer: BC Managed Care – PPO | Admitting: Family Medicine

## 2021-12-29 ENCOUNTER — Encounter: Payer: Self-pay | Admitting: Family Medicine

## 2021-12-29 VITALS — BP 106/64 | HR 84 | Ht 66.0 in | Wt 171.0 lb

## 2021-12-29 DIAGNOSIS — M79672 Pain in left foot: Secondary | ICD-10-CM | POA: Diagnosis not present

## 2021-12-29 NOTE — Patient Instructions (Signed)
Keep taking Vit D K2 for 4 weeks Ice and Voltaren twice a day Avoid being barefoot Hoka or Oofos recovery sandals Do prescribed exercises at least 3x a week See you again in 6-8 weeks

## 2021-12-29 NOTE — Assessment & Plan Note (Signed)
Seems to be over the fourth metatarsal proximally.  I am concerned for a stress reaction.  Discussed rigid soled shoes, avoiding being barefoot, which activities to do which ones to avoid, increase activities noted.  Patient does have Celebrex if needed.  Follow-up with me again in 6 to 8 weeks and if continuing to have pain can consider the possibility of injection or possible advanced imaging if any worsening.

## 2021-12-30 ENCOUNTER — Encounter: Payer: Self-pay | Admitting: Family Medicine

## 2022-02-13 NOTE — Progress Notes (Deleted)
[name] [address] [place] [address] Phone: [phone] Subjective:    I'm seeing this patient by the request  of:  Marguarite Arbour, MD  CC:   [id number]  12/29/2021 Seems to be over the fourth metatarsal proximally.  I am concerned for a stress reaction.  Discussed rigid soled shoes, avoiding being barefoot, which activities to do which ones to avoid, increase activities noted.  Patient does have Celebrex if needed.  Follow-up with me again in 6 to 8 weeks and if continuing to have pain can consider the possibility of injection or possible advanced imaging if any worsening.  Update 02/15/2022 Krista [name] is a 56 y.o. female coming in with complaint of L foot pain. Patient states      No past medical history on file. Past Surgical History:  Procedure Laterality Date   CLOSED MANIPULATION SHOULDER WITH STERIOD INJECTION Left 01/04/2021   Procedure: CLOSED MANIPULATION SHOULDER UNDER ANESTHESIA WITH STEROID INJECTION AND POSSIBLE ARTHROSCOPIC DEBRIDEMENT.;  Surgeon: Christena Flake, MD;  Location: [facility];  Service: Orthopedics;  Laterality: Left;   GANGLION CYST EXCISION     NOSE SURGERY     TUBAL LIGATION     Social History   Socioeconomic History   Marital status: Married    Spouse name: Not on file   Number of children: Not on file   Years of education: Not on file   Highest education level: Not on file  Occupational History   Not on file  Tobacco Use   Smoking status: Never   Smokeless tobacco: Never  Vaping Use   Vaping Use: Never used  Substance and Sexual Activity   Alcohol use: Never   Drug use: Never   Sexual activity: Not on file  Other Topics Concern   Not on file  Social History Narrative   Not on file   Social Determinants of Health   Financial Resource Strain: Not on file  Food Insecurity: Not on file  Transportation Needs: Not on file  Physical Activity: Not on file  Stress: Not on file  Social  Connections: Not on file   No Known Allergies No family history on file.     Current Outpatient Medications (Analgesics):    celecoxib (CELEBREX) 200 MG capsule, Take 200 mg by mouth 2 (two) times daily.   Current Outpatient Medications (Other):    COLLAGEN PO, Take 1 Scoop by mouth daily.   methocarbamol (ROBAXIN) 750 MG tablet, Take 750 mg by mouth every evening.   Reviewed prior external information including notes and imaging from  primary care provider As well as notes that were available from care everywhere and other healthcare systems.  Past medical history, social, surgical and family history all reviewed in electronic medical record.  No pertanent information unless stated regarding to the chief complaint.   Review of Systems:  No headache, visual changes, nausea, vomiting, diarrhea, constipation, dizziness, abdominal pain, skin rash, fevers, chills, night sweats, weight loss, swollen lymph nodes, body aches, joint swelling, chest pain, shortness of breath, mood changes. POSITIVE muscle aches  Objective  There were no vitals taken for this visit.   General: No apparent distress alert and oriented x3 mood and affect normal, dressed appropriately.  HEENT: Pupils equal, extraocular movements intact  Respiratory: Patient's speak in full sentences and does not appear short of breath  Cardiovascular: No lower extremity edema, non tender, no erythema      Impression and Recommendations:

## 2022-02-15 ENCOUNTER — Ambulatory Visit: Payer: BC Managed Care – PPO | Admitting: Family Medicine

## 2022-02-26 NOTE — Progress Notes (Deleted)
[name] [address] [place] [address] Phone: [phone] Subjective:    I'm seeing this patient by the request  of:  Marguarite Arbour, MD  CC:   [id number]  12/29/2021 Seems to be over the fourth metatarsal proximally.  I am concerned for a stress reaction.  Discussed rigid soled shoes, avoiding being barefoot, which activities to do which ones to avoid, increase activities noted.  Patient does have Celebrex if needed.  Follow-up with me again in 6 to 8 weeks and if continuing to have pain can consider the possibility of injection or possible advanced imaging if any worsening.  Update 02/28/2022 Krista [name] is a 56 y.o. female coming in with complaint of L foot pain. Patient states       No past medical history on file. Past Surgical History:  Procedure Laterality Date   CLOSED MANIPULATION SHOULDER WITH STERIOD INJECTION Left 01/04/2021   Procedure: CLOSED MANIPULATION SHOULDER UNDER ANESTHESIA WITH STEROID INJECTION AND POSSIBLE ARTHROSCOPIC DEBRIDEMENT.;  Surgeon: Christena Flake, MD;  Location: [facility];  Service: Orthopedics;  Laterality: Left;   GANGLION CYST EXCISION     NOSE SURGERY     TUBAL LIGATION     Social History   Socioeconomic History   Marital status: Married    Spouse name: Not on file   Number of children: Not on file   Years of education: Not on file   Highest education level: Not on file  Occupational History   Not on file  Tobacco Use   Smoking status: Never   Smokeless tobacco: Never  Vaping Use   Vaping Use: Never used  Substance and Sexual Activity   Alcohol use: Never   Drug use: Never   Sexual activity: Not on file  Other Topics Concern   Not on file  Social History Narrative   Not on file   Social Determinants of Health   Financial Resource Strain: Not on file  Food Insecurity: Not on file  Transportation Needs: Not on file  Physical Activity: Not on file  Stress: Not on file   Social Connections: Not on file   No Known Allergies No family history on file.     Current Outpatient Medications (Analgesics):    celecoxib (CELEBREX) 200 MG capsule, Take 200 mg by mouth 2 (two) times daily.   Current Outpatient Medications (Other):    COLLAGEN PO, Take 1 Scoop by mouth daily.   methocarbamol (ROBAXIN) 750 MG tablet, Take 750 mg by mouth every evening.   Reviewed prior external information including notes and imaging from  primary care provider As well as notes that were available from care everywhere and other healthcare systems.  Past medical history, social, surgical and family history all reviewed in electronic medical record.  No pertanent information unless stated regarding to the chief complaint.   Review of Systems:  No headache, visual changes, nausea, vomiting, diarrhea, constipation, dizziness, abdominal pain, skin rash, fevers, chills, night sweats, weight loss, swollen lymph nodes, body aches, joint swelling, chest pain, shortness of breath, mood changes. POSITIVE muscle aches  Objective  There were no vitals taken for this visit.   General: No apparent distress alert and oriented x3 mood and affect normal, dressed appropriately.  HEENT: Pupils equal, extraocular movements intact  Respiratory: Patient's speak in full sentences and does not appear short of breath  Cardiovascular: No lower extremity edema, non tender, no erythema      Impression and Recommendations:

## 2022-02-28 ENCOUNTER — Ambulatory Visit: Payer: BC Managed Care – PPO | Admitting: Family Medicine

## 2022-03-09 NOTE — Progress Notes (Unsigned)
[name] [address] [place] [address] Phone: [phone] Subjective:   [name], am serving as a scribe for Dr. Antoine Primas.  I'm seeing this patient by the request  of:  Marguarite Arbour, MD  CC: Right groin pain, follow-up on foot pain  HYI:[id number]  12/29/2021 Seems to be over the fourth metatarsal proximally.  I am concerned for a stress reaction.  Discussed rigid soled shoes, avoiding being barefoot, which activities to do which ones to avoid, increase activities noted.  Patient does have Celebrex if needed.  Follow-up with me again in 6 to 8 weeks and if continuing to have pain can consider the possibility of injection or possible advanced imaging if any worsening.  Update 03/13/2022 Krista [name] is a 56 y.o. female coming in with complaint of L foot pain. Patient states that she does still have pain in the midfoot but has had some improvement. Tender to palpation in that area. Pain with walking has subsided.   Patient has developed R groin pain that feels like it is catching.       No past medical history on file. Past Surgical History:  Procedure Laterality Date   CLOSED MANIPULATION SHOULDER WITH STERIOD INJECTION Left 01/04/2021   Procedure: CLOSED MANIPULATION SHOULDER UNDER ANESTHESIA WITH STEROID INJECTION AND POSSIBLE ARTHROSCOPIC DEBRIDEMENT.;  Surgeon: Christena Flake, MD;  Location: [facility];  Service: Orthopedics;  Laterality: Left;   GANGLION CYST EXCISION     NOSE SURGERY     TUBAL LIGATION     Social History   Socioeconomic History   Marital status: Married    Spouse name: Not on file   Number of children: Not on file   Years of education: Not on file   Highest education level: Not on file  Occupational History   Not on file  Tobacco Use   Smoking status: Never   Smokeless tobacco: Never  Vaping Use   Vaping Use: Never used  Substance and Sexual Activity   Alcohol use: Never   Drug use: Never    Sexual activity: Not on file  Other Topics Concern   Not on file  Social History Narrative   Not on file   Social Determinants of Health   Financial Resource Strain: Not on file  Food Insecurity: Not on file  Transportation Needs: Not on file  Physical Activity: Not on file  Stress: Not on file  Social Connections: Not on file   No Known Allergies No family history on file.     Current Outpatient Medications (Analgesics):    celecoxib (CELEBREX) 200 MG capsule, Take 200 mg by mouth 2 (two) times daily.   Current Outpatient Medications (Other):    COLLAGEN PO, Take 1 Scoop by mouth daily.   methocarbamol (ROBAXIN) 750 MG tablet, Take 750 mg by mouth every evening.   Reviewed prior external information including notes and imaging from  primary care provider As well as notes that were available from care everywhere and other healthcare systems.  Past medical history, social, surgical and family history all reviewed in electronic medical record.  No pertanent information unless stated regarding to the chief complaint.   Review of Systems:  No headache, visual changes, nausea, vomiting, diarrhea, constipation, dizziness, abdominal pain, skin rash, fevers, chills, night sweats, weight loss, swollen lymph nodes, body aches, joint swelling, chest pain, shortness of breath, mood changes. POSITIVE muscle aches  Objective  Blood pressure 108/74, pulse 74, height 5'  6" (1.676 m), weight 174 lb (78.9 kg), SpO2 98 %.   General: No apparent distress alert and oriented x3 mood and affect normal, dressed appropriately.  HEENT: Pupils equal, extraocular movements intact  Respiratory: Patient's speak in full sentences and does not appear short of breath  Cardiovascular: No lower extremity edema, non tender, no erythema  Left foot exam does show the patient does have still some very mild swelling over the dorsal aspect.  Tender to palpation over this area.  Good range of motion though of  the ankle.  Right hip though has less than 5 degrees of internal rotation.  Only 25 degrees of external rotation.  Negative straight leg test.  Limited muscular skeletal ultrasound was performed and interpreted by Antoine Primas, M  Limited ultrasound shows the patient does have still arthritic changes of the midfoot noted.  Does have hypoechoic changes of the midfoot at the fourth metatarsal.  No cortical irregularities are noted at this point that is concerning for any type of stress reaction. Impression: Midfoot arthritis with effusion.    Impression and Recommendations:     The above documentation has been reviewed and is accurate and complete [name], DO

## 2022-03-13 ENCOUNTER — Ambulatory Visit: Payer: BC Managed Care – PPO | Admitting: Family Medicine

## 2022-03-13 ENCOUNTER — Ambulatory Visit: Payer: Self-pay

## 2022-03-13 ENCOUNTER — Ambulatory Visit (INDEPENDENT_AMBULATORY_CARE_PROVIDER_SITE_OTHER): Payer: BC Managed Care – PPO

## 2022-03-13 VITALS — BP 108/74 | HR 74 | Ht 66.0 in | Wt 174.0 lb

## 2022-03-13 DIAGNOSIS — M79672 Pain in left foot: Secondary | ICD-10-CM

## 2022-03-13 DIAGNOSIS — M25551 Pain in right hip: Secondary | ICD-10-CM

## 2022-03-13 DIAGNOSIS — M1611 Unilateral primary osteoarthritis, right hip: Secondary | ICD-10-CM | POA: Diagnosis not present

## 2022-03-13 DIAGNOSIS — M25572 Pain in left ankle and joints of left foot: Secondary | ICD-10-CM

## 2022-03-13 NOTE — Patient Instructions (Addendum)
See me in 8 weeks Referral placed to Dr. Jerl Santos

## 2022-03-13 NOTE — Assessment & Plan Note (Signed)
Still has some hypoechoic changes noted in this area.  No cortical irregularity that was noted last time on ultrasound though is improved at the moment.  Discussed icing regimen and home exercises otherwise.  Still with more concerning is now patient's right hip.

## 2022-03-13 NOTE — Assessment & Plan Note (Signed)
Significant loss in range of motion at the moment.  Discussed with patient about the possibility of different treatment options including injection, formal physical therapy.  Unfortunately this is affecting all activities of daily living.  Has less than 5 degrees of internal rotation.  Patient is highly active at this point.  Would like to consider the possibility of surgical intervention and will be referred accordingly.  Discussed Celebrex for breakthrough pain.

## 2022-04-27 ENCOUNTER — Encounter: Payer: Self-pay | Admitting: Family Medicine

## 2022-05-08 ENCOUNTER — Ambulatory Visit: Payer: BC Managed Care – PPO | Admitting: Family Medicine

## 2022-06-15 NOTE — Progress Notes (Unsigned)
[name] [address] [place] [address] Phone: [phone] Subjective:   [name], am serving as a scribe for Dr. Antoine Primas.  I'm seeing this patient by the request  of:  Marguarite Arbour, MD  CC: Left foot pain  [id number]  03/13/2022 Significant loss in range of motion at the moment.  Discussed with patient about the possibility of different treatment options including injection, formal physical therapy.  Unfortunately this is affecting all activities of daily living.  Has less than 5 degrees of internal rotation.  Patient is highly active at this point.  Would like to consider the possibility of surgical intervention and will be referred accordingly.  Discussed Celebrex for breakthrough pain.     Still has some hypoechoic changes noted in this area. No cortical irregularity that was noted last time on ultrasound though is improved at the moment. Discussed icing regimen and home exercises otherwise. Still with more concerning is now patient's right hip.   Update 06/19/2022 Krista [name] is a 56 y.o. female coming in with complaint of R hip, L foot. 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 Patient states that she is doing well. Feels like her leg length diff is worse since surgery.   Continues to have twinge of pain in L foot. Using cane post-hip replacement and due to foot pain. Using Birkenstocks and recovery sandals.       No past medical history on file. Past Surgical History:  Procedure Laterality Date   CLOSED MANIPULATION SHOULDER WITH STERIOD INJECTION Left 01/04/2021   Procedure: CLOSED MANIPULATION SHOULDER UNDER ANESTHESIA WITH STEROID INJECTION AND POSSIBLE ARTHROSCOPIC DEBRIDEMENT.;  Surgeon: Christena Flake, MD;  Location: [facility];  Service: Orthopedics;  Laterality: Left;   GANGLION CYST EXCISION     NOSE SURGERY     TUBAL LIGATION     Social History   Socioeconomic History   Marital status: Married    Spouse name: Not  on file   Number of children: Not on file   Years of education: Not on file   Highest education level: Not on file  Occupational History   Not on file  Tobacco Use   Smoking status: Never   Smokeless tobacco: Never  Vaping Use   Vaping Use: Never used  Substance and Sexual Activity   Alcohol use: Never   Drug use: Never   Sexual activity: Not on file  Other Topics Concern   Not on file  Social History Narrative   Not on file   Social Determinants of Health   Financial Resource Strain: Not on file  Food Insecurity: Not on file  Transportation Needs: Not on file  Physical Activity: Not on file  Stress: Not on file  Social Connections: Not on file   No Known Allergies No family history on file.     Current Outpatient Medications (Analgesics):    celecoxib (CELEBREX) 200 MG capsule, Take 200 mg by mouth 2 (two) times daily.   Current Outpatient Medications (Other):    COLLAGEN PO, Take 1 Scoop by mouth daily.   methocarbamol (ROBAXIN) 750 MG tablet, Take 750 mg by mouth every evening.   Reviewed prior external information including notes and imaging from  primary care provider As well as notes that were available from care everywhere and other healthcare systems.  Past medical history, social, surgical and family history all reviewed in electronic medical record.  No pertanent information unless stated regarding to the chief complaint.   Review  of Systems:  No headache, visual changes, nausea, vomiting, diarrhea, constipation, dizziness, abdominal pain, skin rash, fevers, chills, night sweats, weight loss, swollen lymph nodes, body aches, joint swelling, chest pain, shortness of breath, mood changes. POSITIVE muscle aches  Objective  Blood pressure 118/72, pulse 85, height 5\' 6"  (1.676 m), SpO2 97 %.   General: No apparent distress alert and oriented x3 mood and affect normal, dressed appropriately.  HEENT: Pupils equal, extraocular movements intact  Respiratory:  Patient's speak in full sentences and does not appear short of breath  Cardiovascular: No lower extremity edema, non tender, no erythema  Left foot exam does have some tenderness to palpation over the fourth and fifth metatarsals.  Seems to be more of the midfoot.  Mild feeling of swelling in the area.  No erythema noted.  Good range of motion of the ankle.  Limited muscular skeletal ultrasound was performed and interpreted by , M  Limited ultrasound of patient's left foot does show hypoechoic changes mostly of the fourth and fifth area.  No cortical irregularity but does have some moderate narrowing of the joint space. Impression: Mild arthritis with midfoot sprain    Impression and Recommendations:     The above documentation has been reviewed and is accurate and complete Antoine Primas, DO

## 2022-06-19 ENCOUNTER — Encounter: Payer: Self-pay | Admitting: Family Medicine

## 2022-06-19 ENCOUNTER — Ambulatory Visit: Payer: Self-pay

## 2022-06-19 ENCOUNTER — Ambulatory Visit: Payer: BC Managed Care – PPO | Admitting: Family Medicine

## 2022-06-19 VITALS — BP 118/72 | HR 85 | Ht 66.0 in

## 2022-06-19 DIAGNOSIS — M1611 Unilateral primary osteoarthritis, right hip: Secondary | ICD-10-CM

## 2022-06-19 DIAGNOSIS — M79672 Pain in left foot: Secondary | ICD-10-CM | POA: Diagnosis not present

## 2022-06-19 NOTE — Patient Instructions (Signed)
Great to see you Looks like physiologic leg length Keep work on stretching hip flexor Continue recovery sandals Add voltaren and ice 2x a day See me in 4-5 weeks in case we need injection

## 2022-06-19 NOTE — Assessment & Plan Note (Signed)
Patient does have a left foot sprain noted.  Discussed icing regimen exercises.  Discussed topical anti-inflammatories.  Do think it is worse secondary to palpation.  Is walking with a cane.  Discussed rocker-bottom shoes.  Follow-up again in 4 to 5 weeks and if worsening pain consider injection.

## 2022-06-19 NOTE — Assessment & Plan Note (Signed)
Status post replacement.  Doing well but does have a physiologic leg length discrepancy at the moment.  Follow-up with me again in 6 to 8 weeks

## 2022-07-26 NOTE — Progress Notes (Signed)
[name] Phone: [phone] Subjective:   Krista [name], am serving as a scribe for Dr. Hulan Saas.  I'm seeing this patient by the request  of:  Idelle Crouch, MD  CC: Low back pain but more foot pain  [id number]  06/19/2022 Status post replacement. Doing well but does have a physiologic leg length discrepancy at the moment. Follow-up with me again in 6 to 8 weeks   Patient does have a left foot sprain noted.  Discussed icing regimen exercises.  Discussed topical anti-inflammatories.  Do think it is worse secondary to palpation.  Is walking with a cane.  Discussed rocker-bottom shoes.  Follow-up again in 4 to 5 weeks and if worsening pain consider injection.     Update 08/01/2022 Krista [name] is a 57 y.o. female coming in with complaint of R hip and L foot pain. Patient states that the stiffness in hip improved within the past week. Trying to wear supportive shoes which seem to help the foot. Believes that foot pain will improve as her hip progresses from THR.         [name] past medical history on file. Past Surgical History:  Procedure Laterality Date   CLOSED MANIPULATION SHOULDER WITH STERIOD INJECTION Left 01/04/2021   Procedure: CLOSED MANIPULATION SHOULDER UNDER ANESTHESIA WITH STEROID INJECTION AND POSSIBLE ARTHROSCOPIC DEBRIDEMENT.;  Surgeon: Corky Mull, MD;  Location: [facility];  Service: Orthopedics;  Laterality: Left;   GANGLION CYST EXCISION     NOSE SURGERY     TUBAL LIGATION     Social History   Socioeconomic History   Marital status: Married    Spouse name: Not on file   Number of children: Not on file   Years of education: Not on file   Highest education level: Not on file  Occupational History   Not on file  Tobacco Use   Smoking status: Never   Smokeless tobacco: Never  Vaping Use   Vaping Use: Never used  Substance and Sexual Activity   Alcohol use: Never   Drug  use: Never   Sexual activity: Not on file  Other Topics Concern   Not on file  Social History Narrative   Not on file   Social Determinants of Health   Financial Resource Strain: Not on file  Food Insecurity: Not on file  Transportation Needs: Not on file  Physical Activity: Not on file  Stress: Not on file  Social Connections: Not on file   [name] Known Allergies [name] family history on file.     Current Outpatient Medications (Analgesics):    celecoxib (CELEBREX) 200 MG capsule, Take 200 mg by mouth 2 (two) times daily.   Current Outpatient Medications (Other):    COLLAGEN PO, Take 1 Scoop by mouth daily.   methocarbamol (ROBAXIN) 750 MG tablet, Take 750 mg by mouth every evening.   Reviewed prior external information including notes and imaging from  primary care provider As well as notes that were available from care everywhere and other healthcare systems.  Past medical history, social, surgical and family history all reviewed in electronic medical record.  [name] pertanent information unless stated regarding to the chief complaint.   Review of Systems:  [name] headache, visual changes, nausea, vomiting, diarrhea, constipation, dizziness, abdominal pain, skin rash, fevers, chills, night sweats, weight loss, swollen lymph nodes, body aches, joint swelling, chest pain, shortness of breath, mood changes. POSITIVE muscle aches  Objective  Blood pressure 102/74, pulse 73, height 5\' 6"  (1.676 m), weight 176 lb (79.8 kg), SpO2 98 %.   General: [name] apparent distress alert and oriented x3 mood and affect normal, dressed appropriately.  HEENT: Pupils equal, extraocular movements intact  Respiratory: Patient's speak in full sentences and does not appear short of breath  Cardiovascular: [name] lower extremity edema, non tender, [name] erythema  Patient does have an antalgic gait noted, weakness of the hip abductor.  Patient does have breakdown the transverse arch.  Does have arthritic changes of the  midfoot with a rigid midfoot noted.  Tender to palpation with swelling over the dorsal aspect of the foot over the fourth and fifth metatarsals proximally.  Procedure: Real-time Ultrasound Guided Injection of left midfoot Device: GE Logiq Q7 Ultrasound guided injection is preferred based studies that show increased duration, increased effect, greater accuracy, decreased procedural pain, increased response rate, and decreased cost with ultrasound guided versus blind injection.  Verbal informed consent obtained.  Time-out conducted.  Noted [name] overlying erythema, induration, or other signs of local infection.  Skin prepped in a sterile fashion.  Local anesthesia: Topical Ethyl chloride.  With sterile technique and under real time ultrasound guidance: With a 25-gauge half inch needle injected with 0.5 cc of 0.5% Marcaine and 0.5 cc of Kenalog 40 mg/mL Completed without difficulty  Pain immediately resolved suggesting accurate placement of the medication.  Advised to call if fevers/chills, erythema, induration, drainage, or persistent bleeding.  Impression: Technically successful ultrasound guided injection.    Impression and Recommendations:    The above documentation has been reviewed and is accurate and complete Lyndal Pulley, DO

## 2022-08-01 ENCOUNTER — Ambulatory Visit: Payer: Self-pay

## 2022-08-01 ENCOUNTER — Ambulatory Visit: Payer: BC Managed Care – PPO | Admitting: Family Medicine

## 2022-08-01 VITALS — BP 102/74 | HR 73 | Ht 66.0 in | Wt 176.0 lb

## 2022-08-01 DIAGNOSIS — M79672 Pain in left foot: Secondary | ICD-10-CM | POA: Diagnosis not present

## 2022-08-01 NOTE — Patient Instructions (Addendum)
Injected foot today Good luck with the mean donkey and the fainting goats See me in 7-8 weeks

## 2022-08-01 NOTE — Assessment & Plan Note (Signed)
Patient does have midfoot arthritis noted.  Not making significant improvement with the conservative therapy even though patient is wearing that she is on a regular basis.  Likely secondary to the arthritis of the hip noted.  Discussed with patient about icing regimen and home exercises.  Discussed continuing the rigid soled shoes with appropriate or rocker-bottom shoes follow-up with me again in 7 to 8 weeks

## 2022-09-13 NOTE — Progress Notes (Signed)
[facility] Phone: [phone] Subjective:   Krista Krista [name], am serving as a scribe for Dr. Hulan [name].  I'm seeing this patient by the request  of:  Krista Crouch, MD  CC: Foot pain follow-up  [id number]  08/01/2022 Patient does have midfoot arthritis noted.  Not making significant improvement with the conservative therapy even though patient is wearing that she is on a regular basis.  Likely secondary to the arthritis of the hip noted.  Discussed with patient about icing regimen and home exercises.  Discussed continuing the rigid soled shoes with appropriate or rocker-bottom shoes follow-up with me again in 7 to 8 weeks     Update 09/19/2022 Krista Krista [name] is a 57 y.o. female coming in with complaint of L foot pain. Patient states that she is not having pain in her left foot.   She is feeling less pain and more strength in her hip since replacement. Easier to perform sit to stand without having to pause before taking initial step.       Krista [name] past medical history on file. Past Surgical History:  Procedure Laterality Date   CLOSED MANIPULATION SHOULDER WITH STERIOD INJECTION Left 01/04/2021   Procedure: CLOSED MANIPULATION SHOULDER UNDER ANESTHESIA WITH STEROID INJECTION AND POSSIBLE ARTHROSCOPIC DEBRIDEMENT.;  Surgeon: Krista Mull, MD;  Location: [facility];  Service: Orthopedics;  Laterality: Left;   GANGLION CYST EXCISION     NOSE SURGERY     TUBAL LIGATION     Social History   Socioeconomic History   Marital status: Married    Spouse name: Not on file   Number of children: Not on file   Years of education: Not on file   Highest education level: Not on file  Occupational History   Not on file  Tobacco Use   Smoking status: Never   Smokeless tobacco: Never  Vaping Use   Vaping Use: Never used  Substance and Sexual Activity   Alcohol use: Never   Drug use: Never   Sexual activity: Not on  file  Other Topics Concern   Not on file  Social History Narrative   Not on file   Social Determinants of Health   Financial Resource Strain: Not on file  Food Insecurity: Not on file  Transportation Needs: Not on file  Physical Activity: Not on file  Stress: Not on file  Social Connections: Not on file   Krista [name] Known Allergies Krista [name] family history on file.     Current Outpatient Medications (Analgesics):    celecoxib (CELEBREX) 200 MG capsule, Take 200 mg by mouth 2 (two) times daily.   Current Outpatient Medications (Other):    COLLAGEN PO, Take 1 Scoop by mouth daily.   methocarbamol (ROBAXIN) 750 MG tablet, Take 750 mg by mouth every evening.   Reviewed prior external information including notes and imaging from  primary care provider As well as notes that were available from care everywhere and other   Objective  Blood pressure 98/68, pulse 77, height '5\' 6"'$  (1.676 m), weight 174 lb (78.9 kg), SpO2 97 %.   General: Krista [name] apparent distress alert and oriented x3 mood and affect normal, dressed appropriately.  HEENT: Pupils equal, extraocular movements intact  Respiratory: Patient's speak in full sentences and does not appear short of breath  Cardiovascular: Krista [name] lower extremity edema, non tender, Krista [name] erythema  Foot exam still shows some mild breakdown of the transverse arch.  Some mild  rigidity noted of the midfoot.  Limited muscular skeletal ultrasound was performed and interpreted by Krista Krista [name], M Limited ultrasound does not show any significant hypoechoic changes at this time. Impression interval improvement   Impression and Recommendations:    The above documentation has been reviewed and is accurate and complete Krista Pulley, DO

## 2022-09-19 ENCOUNTER — Ambulatory Visit: Payer: Self-pay

## 2022-09-19 ENCOUNTER — Ambulatory Visit: Payer: BC Managed Care – PPO | Admitting: Family Medicine

## 2022-09-19 VITALS — BP 98/68 | HR 77 | Ht 66.0 in | Wt 174.0 lb

## 2022-09-19 DIAGNOSIS — M79672 Pain in left foot: Secondary | ICD-10-CM

## 2022-09-19 DIAGNOSIS — M25562 Pain in left knee: Secondary | ICD-10-CM | POA: Diagnosis not present

## 2022-09-19 NOTE — Assessment & Plan Note (Signed)
Responded great, no inflammation  Mild OA  Continue good shoes RTC PRN

## 2022-10-17 NOTE — Progress Notes (Deleted)
  [name] [address] [place] [address] Phone: [phone] Subjective:    I'm seeing this patient by the request  of:  Marguarite Arbour, MD  CC: right foot pain   [id number]  09/19/2022 Responded great, no inflammation  Mild OA  Continue good shoes RTC PRN  Updated 10/22/2022 Krista [name] is a 57 y.o. female coming in with complaint of foot pain  Onset-  Location Duration-  Character- Aggravating factors- Reliving factors-  Therapies tried-  Severity-     No past medical history on file. Past Surgical History:  Procedure Laterality Date   CLOSED MANIPULATION SHOULDER WITH STERIOD INJECTION Left 01/04/2021   Procedure: CLOSED MANIPULATION SHOULDER UNDER ANESTHESIA WITH STEROID INJECTION AND POSSIBLE ARTHROSCOPIC DEBRIDEMENT.;  Surgeon: Christena Flake, MD;  Location: [facility];  Service: Orthopedics;  Laterality: Left;   GANGLION CYST EXCISION     NOSE SURGERY     TUBAL LIGATION     Social History   Socioeconomic History   Marital status: Married    Spouse name: Not on file   Number of children: Not on file   Years of education: Not on file   Highest education level: Not on file  Occupational History   Not on file  Tobacco Use   Smoking status: Never   Smokeless tobacco: Never  Vaping Use   Vaping Use: Never used  Substance and Sexual Activity   Alcohol use: Never   Drug use: Never   Sexual activity: Not on file  Other Topics Concern   Not on file  Social History Narrative   Not on file   Social Determinants of Health   Financial Resource Strain: Not on file  Food Insecurity: Not on file  Transportation Needs: Not on file  Physical Activity: Not on file  Stress: Not on file  Social Connections: Not on file   No Known Allergies No family history on file.     Current Outpatient Medications (Analgesics):    celecoxib (CELEBREX) 200 MG capsule, Take 200 mg by mouth 2 (two) times daily.   Current  Outpatient Medications (Other):    COLLAGEN PO, Take 1 Scoop by mouth daily.   methocarbamol (ROBAXIN) 750 MG tablet, Take 750 mg by mouth every evening.   Reviewed prior external information including notes and imaging from  primary care provider As well as notes that were available from care everywhere and other healthcare systems.  Past medical history, social, surgical and family history all reviewed in electronic medical record.  No pertanent information unless stated regarding to the chief complaint.   Review of Systems:  No headache, visual changes, nausea, vomiting, diarrhea, constipation, dizziness, abdominal pain, skin rash, fevers, chills, night sweats, weight loss, swollen lymph nodes, body aches, joint swelling, chest pain, shortness of breath, mood changes. POSITIVE muscle aches  Objective  There were no vitals taken for this visit.   General: No apparent distress alert and oriented x3 mood and affect normal, dressed appropriately.  HEENT: Pupils equal, extraocular movements intact  Respiratory: Patient's speak in full sentences and does not appear short of breath  Cardiovascular: No lower extremity edema, non tender, no erythema  Foot exam shows     Impression and Recommendations:    The above documentation has been reviewed and is accurate and complete [name], DO

## 2022-10-22 ENCOUNTER — Ambulatory Visit: Payer: BC Managed Care – PPO | Admitting: Family Medicine

## 2022-11-20 NOTE — Progress Notes (Signed)
[name] [address] [place] [address] Phone: [phone] Subjective:   [name], am serving as a scribe for Dr. Antoine Primas.  I'm seeing this patient by the request  of:  Marguarite Arbour, MD  CC: Left foot discomfort  [id number]  Krista [name] is a 57 y.o. female coming in with complaint of L foot pain. Patient states that she was doing well but last week she has pain for 3 days in which she was unable to walk without a lot of pain. Pain over lateral aspect. Wonders if she has ganglion cyst in the foot as she has them in the wrist.        No past medical history on file. Past Surgical History:  Procedure Laterality Date   CLOSED MANIPULATION SHOULDER WITH STERIOD INJECTION Left 01/04/2021   Procedure: CLOSED MANIPULATION SHOULDER UNDER ANESTHESIA WITH STEROID INJECTION AND POSSIBLE ARTHROSCOPIC DEBRIDEMENT.;  Surgeon: Christena Flake, MD;  Location: [facility];  Service: Orthopedics;  Laterality: Left;   GANGLION CYST EXCISION     NOSE SURGERY     TUBAL LIGATION     Social History   Socioeconomic History   Marital status: Married    Spouse name: Not on file   Number of children: Not on file   Years of education: Not on file   Highest education level: Not on file  Occupational History   Not on file  Tobacco Use   Smoking status: Never   Smokeless tobacco: Never  Vaping Use   Vaping Use: Never used  Substance and Sexual Activity   Alcohol use: Never   Drug use: Never   Sexual activity: Not on file  Other Topics Concern   Not on file  Social History Narrative   Not on file   Social Determinants of Health   Financial Resource Strain: Not on file  Food Insecurity: Not on file  Transportation Needs: Not on file  Physical Activity: Not on file  Stress: Not on file  Social Connections: Not on file   No Known Allergies No family history on file.     Current Outpatient Medications (Analgesics):     celecoxib (CELEBREX) 200 MG capsule, Take 200 mg by mouth 2 (two) times daily.   Current Outpatient Medications (Other):    COLLAGEN PO, Take 1 Scoop by mouth daily.   methocarbamol (ROBAXIN) 750 MG tablet, Take 750 mg by mouth every evening.   Reviewed prior external information including notes and imaging from  primary care provider As well as notes that were available from care everywhere and other healthcare systems.  Past medical history, social, surgical and family history all reviewed in electronic medical record.  No pertanent information unless stated regarding to the chief complaint.   Review of Systems:  No headache, visual changes, nausea, vomiting, diarrhea, constipation, dizziness, abdominal pain, skin rash, fevers, chills, night sweats, weight loss, swollen lymph nodes, body aches, joint swelling, chest pain, shortness of breath, mood changes. POSITIVE muscle aches  Objective  Blood pressure 110/74, pulse 66, height 5\' 6"  (1.676 m), weight 175 lb (79.4 kg), SpO2 96 %.   General: No apparent distress alert and oriented x3 mood and affect normal, dressed appropriately.  HEENT: Pupils equal, extraocular movements intact  Respiratory: Patient's speak in full sentences and does not appear short of breath  Cardiovascular: No lower extremity edema, non tender, no erythema  Left foot does have a rigid midfoot noted.  Patient does have  some mild tenderness over the dorsal aspect between the fourth and fifth toes.  Does have a bony prominence noted.  No atrophy of the surrounding musculature or soft tissue in the area.  Limited muscular skeletal ultrasound was performed and interpreted by Antoine Primas, M   Limited ultrasound shows the patient does have bony spurring noted of the midfoot in this area.  No cortical irregularity though noted with breakdown of the arch itself. Impression: Midfoot arthritis    Impression and Recommendations:

## 2022-11-21 ENCOUNTER — Ambulatory Visit: Payer: BC Managed Care – PPO | Admitting: Family Medicine

## 2022-11-21 ENCOUNTER — Encounter: Payer: Self-pay | Admitting: Family Medicine

## 2022-11-21 ENCOUNTER — Other Ambulatory Visit: Payer: Self-pay

## 2022-11-21 VITALS — BP 110/74 | HR 66 | Ht 66.0 in | Wt 175.0 lb

## 2022-11-21 DIAGNOSIS — M79672 Pain in left foot: Secondary | ICD-10-CM

## 2022-11-21 NOTE — Patient Instructions (Signed)
Rocker bottom shoes Gravity Defyer or HOKA When in pain 3IBU 3x a day for 3 days Try to not go barefoot See me when you need me

## 2022-11-21 NOTE — Assessment & Plan Note (Signed)
Does have some arthritic changes noted. Seems to be more bone spurring.  I do not see any true other cortical irregularity that makes me concerned for this individual at the moment.  Do believe that some of the bony aspect is some atrophy from the previous injection in the surrounding aspect as well.  Discussed icing regimen and home exercises otherwise.  We discussed proper shoes.  Will follow-up with me as needed

## 2023-02-07 NOTE — Progress Notes (Signed)
I, Stevenson Clinch, CMA acting as a scribe for Clementeen Graham, MD.  Krista [name] is a 57 y.o. female who presents to [facility] today for cont'd L foot pain. Pt was seen by Dr. Katrinka Blazing on 11/21/22. Pt locates pain to the top foot and medial aspect of the ankle. Sx wax and wane. Hx of ganglion cyst in the hands. Notes palpable knot on the medial aspect, hard, TTP. Some swelling of the foot seen on prior u/s. Got short-term relief with last injection done with Dr.Smith.    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   Exam:  BP 102/80   Pulse 69   Ht 5\' 6"  (1.676 m)   Wt 177 lb (80.3 kg)   SpO2 99%   BMI 28.57 kg/m  General: Well Developed, well nourished, and in no acute distress.   MSK: Left foot and ankle some skin hyperpigmentation dorsal lateral foot from prior injection otherwise foot is normal. Normal foot and ankle motion. Tender palpation dorsal lateral midfoot. Pulses cap refill and sensation are intact distally. Strength is intact.    Lab and Radiology Results  Diagnostic Limited MSK Ultrasound of: Right foot and ankle Right medial ankle nodule visualized with ultrasound measuring less than 1 cm in diameter.  This is hypoechoic and filled with fluid and consistent appearance with a ganglion cyst. Dorsal lateral midfoot area of pain visualized.  Midfoot joint with bone spur and joint effusion present.  This is consistent with arthritis on ultrasound. 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   X-ray images right foot and ankle obtained today personally and independently interpreted  Left ankle: No severe arthritis.  No acute fractures are present.  Left foot: Hardware around first MTP from presumed bunion surgery.  Minimal midfoot degenerative changes.  No acute fractures are visible. Await formal radiology review   Assessment and  Plan: 57 y.o. female with  Chronic left midfoot pain and new left ganglion cyst left ankle.  Left midfoot pain: Chronic ongoing issue.  She has had good management so far with Dr. Katrinka Blazing including injection about 6 months ago, good supportive footwear.  Her pain persists.  Will obtain x-rays today and add arch straps and Voltaren gel.  If not enough could proceed with repeat injection or MRI.  Would recommend MRI.  Left ankle ganglion cyst.  Not particularly problematic.  Watchful waiting for now.  Proceed with aspiration and injection in the future if needed.   PDMP not reviewed this encounter. Orders Placed This Encounter  Procedures   [place] LIMITED JOINT SPACE STRUCTURES LOW LEFT(NO LINKED CHARGES)    Order Specific Question:   Reason for Exam (SYMPTOM  OR DIAGNOSIS REQUIRED)    Answer:   left foot pain    Order Specific Question:   Preferred imaging location?    Answer:   [facility] Ankle Complete Left    Standing Status:   Future    Number of Occurrences:   1    Standing Expiration Date:   02/11/2024    Order Specific Question:   Reason for Exam (SYMPTOM  OR DIAGNOSIS REQUIRED)    Answer:   left ankle pain, palpable mass medial aspect    Order Specific Question:   Preferred imaging location?    Answer:   [facility]    Order Specific Question:   Is patient pregnant?  Answer:   No   DG Foot Complete Left    Standing Status:   Future    Number of Occurrences:   1    Standing Expiration Date:   02/11/2024    Order Specific Question:   Reason for Exam (SYMPTOM  OR DIAGNOSIS REQUIRED)    Answer:   left foot pain, palpable mass top of the foot    Order Specific Question:   Preferred imaging location?    Answer:   [facility]    Order Specific Question:   Is patient pregnant?    Answer:   No   No orders of the defined types were placed in this encounter.    Discussed warning signs or symptoms. Please see discharge instructions.  Patient expresses understanding.   The above documentation has been reviewed and is accurate and complete Clementeen Graham, M.D.

## 2023-02-11 ENCOUNTER — Encounter: Payer: Self-pay | Admitting: Family Medicine

## 2023-02-11 ENCOUNTER — Ambulatory Visit: Payer: BC Managed Care – PPO | Admitting: Family Medicine

## 2023-02-11 ENCOUNTER — Other Ambulatory Visit: Payer: Self-pay

## 2023-02-11 ENCOUNTER — Ambulatory Visit (INDEPENDENT_AMBULATORY_CARE_PROVIDER_SITE_OTHER): Payer: BC Managed Care – PPO

## 2023-02-11 VITALS — BP 102/80 | HR 69 | Ht 66.0 in | Wt 177.0 lb

## 2023-02-11 DIAGNOSIS — M79672 Pain in left foot: Secondary | ICD-10-CM

## 2023-02-11 DIAGNOSIS — M6740 Ganglion, unspecified site: Secondary | ICD-10-CM | POA: Diagnosis not present

## 2023-02-11 DIAGNOSIS — M25572 Pain in left ankle and joints of left foot: Secondary | ICD-10-CM

## 2023-02-11 NOTE — Patient Instructions (Addendum)
Thank you for coming in today.  Please get an Xray today before you leave  Please use Voltaren gel (Generic Diclofenac Gel) up to 4x daily for pain as needed.  This is available over-the-counter as both the name brand Voltaren gel and the generic diclofenac gel.   Add arch straps from [facility].   If not better enough next step could be a repeat injection or MRI.

## 2023-02-15 NOTE — Progress Notes (Signed)
Left foot x-ray shows prior bunionectomy.  The foot looks okay to radiology.

## 2023-02-15 NOTE — Progress Notes (Signed)
Left ankle x-ray shows no fractures visible.  No arthritis in the ankle.

## 2023-02-20 ENCOUNTER — Encounter: Payer: Self-pay | Admitting: Family Medicine

## 2023-02-20 DIAGNOSIS — M79672 Pain in left foot: Secondary | ICD-10-CM

## 2023-02-21 NOTE — Telephone Encounter (Signed)
Forwarding to Dr. Corey to advise.  

## 2023-03-04 NOTE — Telephone Encounter (Signed)
Order placed for MRI of the left foot.

## 2023-03-28 ENCOUNTER — Ambulatory Visit: Payer: BC Managed Care – PPO | Attending: Unknown Physician Specialty | Admitting: Speech Pathology

## 2023-03-28 DIAGNOSIS — R490 Dysphonia: Secondary | ICD-10-CM

## 2023-03-28 NOTE — Therapy (Signed)
OUTPATIENT SPEECH LANGUAGE PATHOLOGY  VOICE EVALUATION   Patient Name: Krista [name] MRN: [id number] DOB:14-Oct-1965, 57 y.o., female Today's Date: 03/29/2023  PCP: Aram Beecham, MD REFERRING PROVIDER: Linus Salmons, MD   End of Session - 03/29/23 1156     Visit Number 1    Number of Visits 17    Date for SLP Re-Evaluation 05/23/23    Authorization Type [employer] [facility]    Authorization - Visit Number 1    Authorization - Number of Visits 30    Progress Note Due on Visit 10    SLP Start Time 1115    SLP Stop Time  1200    SLP Time Calculation (min) 45 min    Activity Tolerance Patient tolerated treatment well             No past medical history on file. Past Surgical History:  Procedure Laterality Date   CLOSED MANIPULATION SHOULDER WITH STERIOD INJECTION Left 01/04/2021   Procedure: CLOSED MANIPULATION SHOULDER UNDER ANESTHESIA WITH STEROID INJECTION AND POSSIBLE ARTHROSCOPIC DEBRIDEMENT.;  Surgeon: Christena Flake, MD;  Location: [facility];  Service: Orthopedics;  Laterality: Left;   GANGLION CYST EXCISION     NOSE SURGERY     TUBAL LIGATION     Patient Active Problem List   Diagnosis Date Noted   Arthritis of right hip 03/13/2022   Pain of left midfoot 12/29/2021   Degenerative disc disease, lumbar 06/19/2021   Greater trochanteric bursitis of right hip 05/08/2021    ONSET DATE:  03/27/2023  REFERRING DIAG:  R49.0 (ICD-10-CM) - Hoarseness  R49.0 (ICD-10-CM) - Dysphonia    THERAPY DIAG:  Dysphonia  Rationale for Evaluation and Treatment Rehabilitation  SUBJECTIVE:   SUBJECTIVE STATEMENT: Pt pleasant, good historian Pt accompanied by: self  PERTINENT HISTORY and DIAGNOSTIC FINDINGS: Pt is a 56 year old female who was referred by her ENT Linus Salmons, MD) for mild dysphonia and globus sensation that developed suddenly following coughing associated with COVID in June/July 2024. Laryngoscopy on 03/26/2023 revealed "no tumor or mass seen,  normal mobility, mild VC bowing."    PAIN:  Are you having pain? No   FALLS: Has patient fallen in last 6 months? 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, Warehouse manager intake: optimal Caffeine/alcohol intake: minimal Daily voice use: moderate Environmental risks: Dry or dusty environment Occupational risks: Sport and exercise psychologist, Designer, industrial/product, and Parent Misuse: Excessively low pitch, Tension, Speaks without adequate warm-up, Speaks without adequate breath support, and Speaks on residual capacity Phonotraumatic behaviors: Performs or speaks to large groups without amplification and Excessive voice use during colds/illnesses  PERCEPTUAL VOICE ASSESSMENT: Voice quality: hoarse, breathy, low vocal intensity, and vocal fatigue Vocal abuse: excessive voice use Resonance: normal Respiratory function: clavicular breathing  OBJECTIVE VOICE ASSESSMENT: Sustained "ah" maximum phonation time: 11.5 seconds Sustained "ah" loudness average: 72 dB Average fundamental frequency during sustained "ah":210 Hz   (1.5 SD below average of  244 Hz +/- 27 for gender)  Oral reading (passage) loudness average: 77 dB Oral reading loudness range: 27 dB Conversational pitch average: 218 Hz Highest dynamic pitch in conversational speech: 274 Hz Lowest dynamic pitch in conversational speech: 166 Hz Conversational pitch range: 108 Hz Conversational loudness average: 74 dB Conversational loudness range: 200 dB S/z  ratio: 1.4 (Suggestive of dysfunction >1.0) Voice quality: hoarse, breathy, rough, strained, low vocal intensity, and vocal fatigue     ORAL MOTOR EXAMINATION Facial : WFL Lingual: WFL Velum: WFL Mandible: WFL Cough: Productive   PATIENT  REPORTED OUTCOME MEASURES (PROM):  VOICE HANDICAP INDEX (VHI)  The Voice Handicap Index is comprised of a series of questions to assess the patient's perception of their voice. It is designed to evaluate the emotional, physical and functional components of the voice problem.  Functional: 0 Physical: 5 Emotional: 3 Total: 8 (Normal mean 8.75, SD =14.97)  z score =  0 no significant impact = 0-1.00,   TODAY'S TREATMENT:  N/A   PATIENT EDUCATION: Education details: results of this assessment, ST POC Person educated: Patient Education method: Explanation Education comprehension: verbalized understanding and needs further education   HOME EXERCISE PROGRAM: N/A     GOALS: Goals reviewed with patient? Yes  SHORT TERM GOALS: Target date: 10 sessions  The patient will maximize voice quality and loudness using breath support/oral resonance for sustained vowel production, pitch glides, and hierarchal speech drill.  Baseline: Goal status: INITIAL  2.  The patient will eliminate phonotraumatic behaviors such as chronic throat clearing, by substituting non-traumatic methods to clear mucus.  Baseline:  Goal status: INITIAL  3.  The patient will demonstrate abdominal breathing patterns and steady release of breath on exhalation to optimize efficiency of voicing and decrease laryngeal hyperfunction.  Baseline:  Goal status: INITIAL   LONG TERM GOALS: Target date: 05/23/2023  The patient will be independent for abdominal breathing and breath support exercises.  Baseline:  Goal status: INITIAL  2.  The patient will demonstrate independent understanding of vocal hygiene concepts. Baseline:  Goal status: INITIAL  3.  The patient will decrease laryngeal and articulatory muscle tension by independently completing relaxation/stretching exercises. Baseline:  Goal status: INITIAL  4.  The patient will participate in 5-8 minutes conversation, maintaining average loudness of 75 dB  and loud, good quality voice with min cues.    Baseline:  Goal status: INITIAL   ASSESSMENT:  CLINICAL IMPRESSION: Patient is a 57 y.o. female who was seen today for a qualitative voice evaluation. Pt presents with mild dysphonia that is c/b low pitch, breathy, hoarse vocal quality.    OBJECTIVE IMPAIRMENTS include voice disorder. These impairments are limiting patient from effectively communicating at home and in community. Factors affecting potential to achieve goals and functional outcome are  N/A . Patient will benefit from skilled SLP services to address above impairments and improve overall function.  REHAB POTENTIAL: Good  PLAN: SLP FREQUENCY: 1-2x/week  SLP DURATION: 8 weeks  PLANNED INTERVENTIONS: SLP instruction and feedback and Patient/family education     [facility] [address] [place], [place], [address] Phone: [phone]   Fax:  [phone]

## 2023-04-02 ENCOUNTER — Ambulatory Visit: Payer: BC Managed Care – PPO | Admitting: Speech Pathology

## 2023-04-02 DIAGNOSIS — R490 Dysphonia: Secondary | ICD-10-CM | POA: Diagnosis not present

## 2023-04-02 NOTE — Therapy (Signed)
OUTPATIENT SPEECH LANGUAGE PATHOLOGY  VOICE TREATMENT   Patient Name: Krista [name] MRN: [id number] DOB:[birth year], 58 y.o., female Today's Date: 04/02/2023  PCP: Aram Beecham, MD REFERRING PROVIDER: Linus Salmons, MD   End of Session - 04/02/23 1051     Visit Number 2    Number of Visits 17    Date for SLP Re-Evaluation 05/23/23    Authorization Type [employer] [facility]    Authorization - Visit Number 2    Authorization - Number of Visits 30    Progress Note Due on Visit 10    SLP Start Time 1050    SLP Stop Time  1120    SLP Time Calculation (min) 30 min    Activity Tolerance Patient tolerated treatment well             No past medical history on file. Past Surgical History:  Procedure Laterality Date   CLOSED MANIPULATION SHOULDER WITH STERIOD INJECTION Left 01/04/2021   Procedure: CLOSED MANIPULATION SHOULDER UNDER ANESTHESIA WITH STEROID INJECTION AND POSSIBLE ARTHROSCOPIC DEBRIDEMENT.;  Surgeon: Christena Flake, MD;  Location: [facility] ORS;  Service: Orthopedics;  Laterality: Left;   GANGLION CYST EXCISION     NOSE SURGERY     TUBAL LIGATION     Patient Active Problem List   Diagnosis Date Noted   Arthritis of right hip 03/13/2022   Pain of left midfoot 12/29/2021   Degenerative disc disease, lumbar 06/19/2021   Greater trochanteric bursitis of right hip 05/08/2021    ONSET DATE:  03/27/2023  REFERRING DIAG:  R49.0 (ICD-10-CM) - Hoarseness  R49.0 (ICD-10-CM) - Dysphonia    THERAPY DIAG:  Dysphonia  Rationale for Evaluation and Treatment Rehabilitation  SUBJECTIVE:   PERTINENT HISTORY and DIAGNOSTIC FINDINGS: Pt is a 57 year old female who was referred by her ENT Linus Salmons, MD) for mild dysphonia and globus sensation that developed suddenly following coughing associated with COVID in June/July 2024. Laryngoscopy on 03/26/2023 revealed "no tumor or mass seen, normal mobility, mild VC bowing."    PAIN:  Are you having pain? No   FALLS:  Has patient fallen in last 6 months? No,   LIVING ENVIRONMENT: Lives with: lives with their family Lives in: House/apartment  PLOF: Independent  PATIENT GOALS    to improve voice  SUBJECTIVE STATEMENT: Pt pleasant, eager to participate Pt accompanied by: self  OBJECTIVE:   TODAY'S TREATMENT:  Skilled treatment session focused on pt's dysphonia goals. SLP facilitated session by providing the following interventions:   Pt states that she had some singing engagements over the weekend. She reports difficulty having the "power (respiratory support)" that she usually does when singing. Education provided on potential that emotional tension can have on vocal performance specifically respiratory and laryngeal tension.   SLP further instructed pt in activities to perform daily to promote relaxed voicing and singing as well as vocal warm ups.   Extensive education provided that Neck range of motion exercises should be done to the point of feeling a GENTLE, TOLERABLE stretch only. Demonstration provided with pt able to imitate for the following stretches:  Head Tilt: Forward and Back - Gently bow your head and try to touch your chin to your chest. Raise your chin back to the starting position. Tilt your head back as far as possible so you are looking up at the ceiling. Return your head to the starting position. - supervision to Mod I  Head Tilt: Side to Side: Tilt your head to the side, bringing  your ear toward your shoulder. Do not raise your shoulder to your ear. Keep your shoulder still. Return your head to the starting position. Supervision to Mod I  Head turns: Turn your head to look over your shoulder. Tilt your chin down and try to touch it to your shoulder. Do not raise your shoulder to your chin. Face forward again -Supervision to Mod I    Vocal Warm ups  Sustained hum  Pulsating hum  Pitch glides  Hummed melody     PATIENT EDUCATION: Education details: see the above Person  educated: Patient Education method: Explanation Education comprehension: verbalized understanding and needs further education   HOME EXERCISE PROGRAM: Neck stretches Vocal warm ups     GOALS: Goals reviewed with patient? Yes  SHORT TERM GOALS: Target date: 10 sessions  The patient will maximize voice quality and loudness using breath support/oral resonance for sustained vowel production, pitch glides, and hierarchal speech drill.  Baseline: Goal status: INITIAL  2.  The patient will eliminate phonotraumatic behaviors such as chronic throat clearing, by substituting non-traumatic methods to clear mucus.  Baseline:  Goal status: INITIAL  3.  The patient will demonstrate abdominal breathing patterns and steady release of breath on exhalation to optimize efficiency of voicing and decrease laryngeal hyperfunction.  Baseline:  Goal status: INITIAL   LONG TERM GOALS: Target date: 05/23/2023  The patient will be independent for abdominal breathing and breath support exercises.  Baseline:  Goal status: INITIAL  2.  The patient will demonstrate independent understanding of vocal hygiene concepts. Baseline:  Goal status: INITIAL  3.  The patient will decrease laryngeal and articulatory muscle tension by independently completing relaxation/stretching exercises. Baseline:  Goal status: INITIAL  4.  The patient will participate in 5-8 minutes conversation, maintaining average loudness of 75 dB and loud, good quality voice with min cues.    Baseline:  Goal status: INITIAL   ASSESSMENT:  CLINICAL IMPRESSION: Patient is a 57 y.o. female who was seen today for a behavioral voice treatment d/t mild dysphonia that is c/b low pitch, breathy, hoarse vocal quality.  She eagerly participated in session. See details from the session in the treatment note above.   OBJECTIVE IMPAIRMENTS include voice disorder. These impairments are limiting patient from effectively communicating at home  and in community. Factors affecting potential to achieve goals and functional outcome are  N/A . Patient will benefit from skilled SLP services to address above impairments and improve overall function.  REHAB POTENTIAL: Good  PLAN: SLP FREQUENCY: 1-2x/week  SLP DURATION: 8 weeks  PLANNED INTERVENTIONS: SLP instruction and feedback and Patient/family education    Erricka Falkner B. Dreama Saa, M.S., CCC-SLP, Tree surgeon Certified Brain Injury Specialist [facility]  [facility] Rehabilitation Services Office [phone] Ascom [phone] Fax [phone]

## 2023-04-04 NOTE — Progress Notes (Signed)
[name] [address] [place] [address] Phone: [phone] Subjective:   [name], am serving as a scribe for Dr. Antoine Primas.  I'm seeing this patient by the request  of:  Marguarite Arbour, MD  CC: Left foot pain  [id number]  11/21/2022 Does have some arthritic changes noted. Seems to be more bone spurring.  I do not see any true other cortical irregularity that makes me concerned for this individual at the moment.  Do believe that some of the bony aspect is some atrophy from the previous injection in the surrounding aspect as well.  Discussed icing regimen and home exercises otherwise.  We discussed proper shoes.  Will follow-up with me as needed   Updated 04/08/2023 Krista [name] is a 57 y.o. female coming in with complaint of midfoot pain. L foot pain. Not sure if she needs MRI. Pain over metatarsal of 4th and 5th. Has used Arnica topical. Take the edge off.      No past medical history on file. Past Surgical History:  Procedure Laterality Date   CLOSED MANIPULATION SHOULDER WITH STERIOD INJECTION Left 01/04/2021   Procedure: CLOSED MANIPULATION SHOULDER UNDER ANESTHESIA WITH STEROID INJECTION AND POSSIBLE ARTHROSCOPIC DEBRIDEMENT.;  Surgeon: Christena Flake, MD;  Location: [facility] ORS;  Service: Orthopedics;  Laterality: Left;   GANGLION CYST EXCISION     NOSE SURGERY     TUBAL LIGATION     Social History   Socioeconomic History   Marital status: Married    Spouse name: Not on file   Number of children: Not on file   Years of education: Not on file   Highest education level: Not on file  Occupational History   Not on file  Tobacco Use   Smoking status: Never   Smokeless tobacco: Never  Vaping Use   Vaping status: Never Used  Substance and Sexual Activity   Alcohol use: Never   Drug use: Never   Sexual activity: Not on file  Other Topics Concern   Not on file  Social History Narrative   Not on file   Social  Determinants of Health   Financial Resource Strain: Low Risk  (04/03/2023)   Received from [facility]   Overall Financial Resource Strain (CARDIA)    Difficulty of Paying Living Expenses: Not hard at all  Food Insecurity: No Food Insecurity (04/03/2023)   Received from [facility]   Hunger Vital Sign    Worried About Running Out of Food in the Last Year: Never true    Ran Out of Food in the Last Year: Never true  Transportation Needs: No Transportation Needs (04/03/2023)   Received from [facility] - Transportation    In the past 12 months, has lack of transportation kept you from medical appointments or from getting medications?: No    Lack of Transportation (Non-Medical): No  Physical Activity: Not on file  Stress: Not on file  Social Connections: Not on file   No Known Allergies No family history on file.     Current Outpatient Medications (Analgesics):    celecoxib (CELEBREX) 200 MG capsule, Take 200 mg by mouth 2 (two) times daily.   Current Outpatient Medications (Other):    Vitamin D, Ergocalciferol, (DRISDOL) 1.25 MG (50000 UNIT) CAPS capsule, Take 1 capsule (50,000 Units total) by mouth every 7 (seven) days.   COLLAGEN PO, Take 1 Scoop by mouth daily.  methocarbamol (ROBAXIN) 750 MG tablet, Take 750 mg by mouth every evening.   Reviewed prior external information including notes and imaging from  primary care provider As well as notes that were available from care everywhere and other healthcare systems.  Past medical history, social, surgical and family history all reviewed in electronic medical record.  No pertanent information unless stated regarding to the chief complaint.   Review of Systems:  No headache, visual changes, nausea, vomiting, diarrhea, constipation, dizziness, abdominal pain, skin rash, fevers, chills, night sweats, weight loss, swollen lymph nodes, body aches, joint swelling, chest  pain, shortness of breath, mood changes. POSITIVE muscle aches  Objective  Blood pressure 114/82, pulse 88, height 5\' 6"  (1.676 m), weight 177 lb (80.3 kg), SpO2 97%.   General: No apparent distress alert and oriented x3 mood and affect normal, dressed appropriately.  HEENT: Pupils equal, extraocular movements intact  Respiratory: Patient's speak in full sentences and does not appear short of breath  Cardiovascular: No lower extremity edema, non tender, no erythema  Left foot exam shows the patient does have some tender to palpation noted.  Seems to have some mild swelling over the fourth and fifth metatarsals.  Seems to be more on the proximal aspect.  Limited muscular skeletal ultrasound was performed and interpreted by Antoine Primas, M  Patient does have some hypoechoic changes.  Patient's fourth metatarsal has significant hypoechoic changes in the joint space itself but then approximately 2 and does have a cortical irregularity that is consistent with a fracture.  Increasing in neovascularization in Doppler flow also noted. Impression: Fourth metatarsal fracture    Impression and Recommendations:     The above documentation has been reviewed and is accurate and complete [name], DO

## 2023-04-05 ENCOUNTER — Ambulatory Visit: Payer: BC Managed Care – PPO | Admitting: Speech Pathology

## 2023-04-05 DIAGNOSIS — R490 Dysphonia: Secondary | ICD-10-CM

## 2023-04-05 NOTE — Therapy (Signed)
OUTPATIENT SPEECH LANGUAGE PATHOLOGY  VOICE TREATMENT   Patient Name: JAXYN DYKSTRA MRN: [id number] DOB:1965/11/18, 57 y.o., female Today's Date: 04/05/2023  PCP: Aram Beecham, MD REFERRING PROVIDER: Linus Salmons, MD   End of Session - 04/05/23 1113     Visit Number 3    Number of Visits 17    Date for SLP Re-Evaluation 05/23/23    Authorization Type [employer] Cross [facility]    Authorization - Visit Number 3    Authorization - Number of Visits 30    Progress Note Due on Visit 10    SLP Start Time 0800    SLP Stop Time  0845    SLP Time Calculation (min) 45 min    Activity Tolerance Patient tolerated treatment well             No past medical history on file. Past Surgical History:  Procedure Laterality Date   CLOSED MANIPULATION SHOULDER WITH STERIOD INJECTION Left 01/04/2021   Procedure: CLOSED MANIPULATION SHOULDER UNDER ANESTHESIA WITH STEROID INJECTION AND POSSIBLE ARTHROSCOPIC DEBRIDEMENT.;  Surgeon: Christena Flake, MD;  Location: [facility] ORS;  Service: Orthopedics;  Laterality: Left;   GANGLION CYST EXCISION     NOSE SURGERY     TUBAL LIGATION     Patient Active Problem List   Diagnosis Date Noted   Arthritis of right hip 03/13/2022   Pain of left midfoot 12/29/2021   Degenerative disc disease, lumbar 06/19/2021   Greater trochanteric bursitis of right hip 05/08/2021    ONSET DATE:  03/27/2023  REFERRING DIAG:  R49.0 (ICD-10-CM) - Hoarseness  R49.0 (ICD-10-CM) - Dysphonia    THERAPY DIAG:  Dysphonia  Rationale for Evaluation and Treatment Rehabilitation  SUBJECTIVE:   PERTINENT HISTORY and DIAGNOSTIC FINDINGS: Pt is a 57 year old female who was referred by her ENT Linus Salmons, MD) for mild dysphonia and globus sensation that developed suddenly following coughing associated with COVID in June/July 2024. Laryngoscopy on 03/26/2023 revealed "no tumor or mass seen, normal mobility, mild VC bowing."    PAIN:  Are you having pain? No   FALLS:  Has patient fallen in last 6 months? No,   LIVING ENVIRONMENT: Lives with: lives with their family Lives in: House/apartment  PLOF: Independent  PATIENT GOALS    to improve voice  SUBJECTIVE STATEMENT: "I was telling my husband about what we talked about" Pt accompanied by: self  OBJECTIVE:   TODAY'S TREATMENT:  Skilled treatment session focused on pt's dysphonia goals. SLP facilitated session by providing the following interventions:   Pt reports good compliance with HEP.   Skilled verbal education with use of photos provided on vocal cords, function and bowing and age.   SLP further instructed pt in activities to perform daily to promote relaxed voicing and singing as well as vocal warm ups.   Extensive education provided that Neck range of motion exercises should be done to the point of feeling a GENTLE, TOLERABLE stretch only. Demonstration provided with pt able to imitate for the following stretches:  Head Tilt: Forward and Back - Gently bow your head and try to touch your chin to your chest. Raise your chin back to the starting position. Tilt your head back as far as possible so you are looking up at the ceiling. Return your head to the starting position. - supervision to Mod I  Head Tilt: Side to Side: Tilt your head to the side, bringing your ear toward your shoulder. Do not raise your shoulder to your ear.  Keep your shoulder still. Return your head to the starting position. Supervision to Mod I  Head turns: Turn your head to look over your shoulder. Tilt your chin down and try to touch it to your shoulder. Do not raise your shoulder to your chin. Face forward again -Supervision to Mod I    Vocal Warm ups  Sustained hum  Pulsating hum  Pitch glides  Hummed melody     PATIENT EDUCATION: Education details: see the above Person educated: Patient Education method: Explanation Education comprehension: verbalized understanding and needs further education   HOME EXERCISE  PROGRAM: Neck stretches Vocal warm ups     GOALS: Goals reviewed with patient? Yes  SHORT TERM GOALS: Target date: 10 sessions  The patient will maximize voice quality and loudness using breath support/oral resonance for sustained vowel production, pitch glides, and hierarchal speech drill.  Baseline: Goal status: INITIAL  2.  The patient will eliminate phonotraumatic behaviors such as chronic throat clearing, by substituting non-traumatic methods to clear mucus.  Baseline:  Goal status: INITIAL  3.  The patient will demonstrate abdominal breathing patterns and steady release of breath on exhalation to optimize efficiency of voicing and decrease laryngeal hyperfunction.  Baseline:  Goal status: INITIAL   LONG TERM GOALS: Target date: 05/23/2023  The patient will be independent for abdominal breathing and breath support exercises.  Baseline:  Goal status: INITIAL  2.  The patient will demonstrate independent understanding of vocal hygiene concepts. Baseline:  Goal status: INITIAL  3.  The patient will decrease laryngeal and articulatory muscle tension by independently completing relaxation/stretching exercises. Baseline:  Goal status: INITIAL  4.  The patient will participate in 5-8 minutes conversation, maintaining average loudness of 75 dB and loud, good quality voice with min cues.    Baseline:  Goal status: INITIAL   ASSESSMENT:  CLINICAL IMPRESSION: Patient is a 57 y.o. female who was seen today for a behavioral voice treatment d/t mild dysphonia that is c/b low pitch, breathy, hoarse vocal quality.  She eagerly participated in session. See details from the session in the treatment note above.   OBJECTIVE IMPAIRMENTS include voice disorder. These impairments are limiting patient from effectively communicating at home and in community. Factors affecting potential to achieve goals and functional outcome are  N/A . Patient will benefit from skilled SLP services to  address above impairments and improve overall function.  REHAB POTENTIAL: Good  PLAN: SLP FREQUENCY: 1-2x/week  SLP DURATION: 8 weeks  PLANNED INTERVENTIONS: SLP instruction and feedback and Patient/family education    Cambryn Charters B. Dreama Saa, M.S., CCC-SLP, Tree surgeon Certified Brain Injury Specialist [facility]  [facility] Rehabilitation Services Office [phone] Ascom [phone] Fax [phone]

## 2023-04-08 ENCOUNTER — Ambulatory Visit (INDEPENDENT_AMBULATORY_CARE_PROVIDER_SITE_OTHER): Payer: BC Managed Care – PPO | Admitting: Family Medicine

## 2023-04-08 ENCOUNTER — Other Ambulatory Visit: Payer: Self-pay

## 2023-04-08 ENCOUNTER — Encounter: Payer: Self-pay | Admitting: Family Medicine

## 2023-04-08 VITALS — BP 114/82 | HR 88 | Ht 66.0 in | Wt 177.0 lb

## 2023-04-08 DIAGNOSIS — M2550 Pain in unspecified joint: Secondary | ICD-10-CM | POA: Diagnosis not present

## 2023-04-08 DIAGNOSIS — M79672 Pain in left foot: Secondary | ICD-10-CM

## 2023-04-08 LAB — VITAMIN B12: Vitamin B-12: 501 pg/mL (ref 211–911)

## 2023-04-08 LAB — FERRITIN: Ferritin: 44 ng/mL (ref 10.0–291.0)

## 2023-04-08 LAB — SEDIMENTATION RATE: Sed Rate: 11 mm/hr (ref 0–30)

## 2023-04-08 LAB — URIC ACID: Uric Acid, Serum: 4.8 mg/dL (ref 2.4–7.0)

## 2023-04-08 LAB — C-REACTIVE PROTEIN: CRP: <1 mg/dL (ref 0.5–20.0)

## 2023-04-08 MED ORDER — VITAMIN D (ERGOCALCIFEROL) 1.25 MG (50000 UNIT) PO CAPS: 50000.0000 [IU] | ORAL_CAPSULE | ORAL | 0 refills | Status: DC

## 2023-04-08 NOTE — Patient Instructions (Addendum)
K2 daily  Once weekly Vit D Lab work today See me again in 4 weeks

## 2023-04-08 NOTE — Assessment & Plan Note (Addendum)
Patient has now what appears to be a stress fracture noted.  CAM Walker, vitamin D given, laboratory workup ordered as well.  Will see if anything is abnormal.  Patient is scheduled for an MRI in October.  Discussed that if pain is not improving she should continue with getting this done.  Discussed when patient is seated and to come out of the boot and move the ankle as we do not get significant stiffness.  Follow-up again 4 weeks

## 2023-04-09 ENCOUNTER — Ambulatory Visit: Payer: BC Managed Care – PPO | Admitting: Speech Pathology

## 2023-04-09 LAB — IBC PANEL
Iron: 77 ug/dL (ref 42–145)
Saturation Ratios: 22.4 % (ref 20.0–50.0)
TIBC: 343 ug/dL (ref 250.0–450.0)
Transferrin: 245 mg/dL (ref 212.0–360.0)

## 2023-04-10 LAB — CYCLIC CITRUL PEPTIDE ANTIBODY, IGG: Cyclic Citrullin Peptide Ab: <16 UNITS

## 2023-04-10 LAB — ANGIOTENSIN CONVERTING ENZYME: Angiotensin-Converting Enzyme: 48 U/L (ref 9–67)

## 2023-04-10 LAB — PTH, INTACT AND CALCIUM
Calcium: 9.6 mg/dL (ref 8.6–10.4)
PTH: 44 pg/mL (ref 16–77)

## 2023-04-10 LAB — ANA: Anti Nuclear Antibody (ANA): NEGATIVE

## 2023-04-11 ENCOUNTER — Ambulatory Visit: Payer: BC Managed Care – PPO | Admitting: Speech Pathology

## 2023-04-11 DIAGNOSIS — R490 Dysphonia: Secondary | ICD-10-CM

## 2023-04-11 NOTE — Therapy (Signed)
OUTPATIENT SPEECH LANGUAGE PATHOLOGY  VOICE TREATMENT   Patient Name: Krista [name] MRN: [id number] DOB:02/25/1966, 57 y.o., female Today's Date: 04/11/2023  PCP: Aram Beecham, MD REFERRING PROVIDER: Linus Salmons, MD   End of Session - 04/11/23 0844     Visit Number 4    Number of Visits 17    Date for SLP Re-Evaluation 05/23/23    Authorization Type [facility]    Authorization - Visit Number 4    Authorization - Number of Visits 30    Progress Note Due on Visit 10    SLP Start Time 0845    SLP Stop Time  0930    SLP Time Calculation (min) 45 min    Activity Tolerance Patient tolerated treatment well             No past medical history on file. Past Surgical History:  Procedure Laterality Date   CLOSED MANIPULATION SHOULDER WITH STERIOD INJECTION Left 01/04/2021   Procedure: CLOSED MANIPULATION SHOULDER UNDER ANESTHESIA WITH STEROID INJECTION AND POSSIBLE ARTHROSCOPIC DEBRIDEMENT.;  Surgeon: Christena Flake, MD;  Location: [facility] ORS;  Service: Orthopedics;  Laterality: Left;   GANGLION CYST EXCISION     NOSE SURGERY     TUBAL LIGATION     Patient Active Problem List   Diagnosis Date Noted   Arthritis of right hip 03/13/2022   Pain of left midfoot 12/29/2021   Degenerative disc disease, lumbar 06/19/2021   Greater trochanteric bursitis of right hip 05/08/2021    ONSET DATE:  03/27/2023  REFERRING DIAG:  R49.0 (ICD-10-CM) - Hoarseness  R49.0 (ICD-10-CM) - Dysphonia    THERAPY DIAG:  Dysphonia  Rationale for Evaluation and Treatment Rehabilitation  SUBJECTIVE:   PERTINENT HISTORY and DIAGNOSTIC FINDINGS: Pt is a 57 year old female who was referred by her ENT Linus Salmons, MD) for mild dysphonia and globus sensation that developed suddenly following coughing associated with COVID in June/July 2024. Laryngoscopy on 03/26/2023 revealed "no tumor or mass seen, normal mobility, mild VC bowing."    PAIN:  Are you having pain? No   FALLS:  Has patient fallen in last 6 months? No,   LIVING ENVIRONMENT: Lives with: lives with their family Lives in: House/apartment  PLOF: Independent  PATIENT GOALS    to improve voice  SUBJECTIVE STATEMENT: "I sang on Tuesday, it is like something jumps up in my voice" Pt accompanied by: self  OBJECTIVE:   TODAY'S TREATMENT:  Skilled treatment session focused on pt's dysphonia goals. SLP facilitated session by providing the following interventions:   Pt reports good compliance with HEP.   Pt reports that she sang on Tuesday and reports feeling like something "jumps up in her voice" during singing  Extensive education provided that Neck range of motion exercises should be done to the point of feeling a GENTLE, TOLERABLE stretch only. Demonstration provided with pt able to imitate for the following stretches:  Head Tilt: Forward and Back - Gently bow your head and try to touch your chin to your chest. Raise your chin back to the starting position. Tilt your head back as far as possible so you are looking up at the ceiling. Return your head to the starting position. - Independent  Head Tilt: Side to Side: Tilt your head to the side, bringing your ear toward your shoulder. Do not raise your shoulder to your ear. Keep your shoulder still. Return your head to the starting position. Independent  Head turns: Turn your head to look over  your shoulder. Tilt your chin down and try to touch it to your shoulder. Do not raise your shoulder to your chin. Face forward again -Independent   Voice Building introduced targeting respiratory support during sustained vowels, DOW, MOYPt also engaged in singing without any instances of dysfunction    PATIENT EDUCATION: Education details: see the above Person educated: Patient Education method: Explanation Education comprehension: verbalized understanding and needs further education   HOME EXERCISE PROGRAM: Neck stretches Vocal warm ups Record self  singing     GOALS: Goals reviewed with patient? Yes  SHORT TERM GOALS: Target date: 10 sessions  The patient will maximize voice quality and loudness using breath support/oral resonance for sustained vowel production, pitch glides, and hierarchal speech drill.  Baseline: Goal status: INITIAL  2.  The patient will eliminate phonotraumatic behaviors such as chronic throat clearing, by substituting non-traumatic methods to clear mucus.  Baseline:  Goal status: INITIAL  3.  The patient will demonstrate abdominal breathing patterns and steady release of breath on exhalation to optimize efficiency of voicing and decrease laryngeal hyperfunction.  Baseline:  Goal status: INITIAL   LONG TERM GOALS: Target date: 05/23/2023  The patient will be independent for abdominal breathing and breath support exercises.  Baseline:  Goal status: INITIAL  2.  The patient will demonstrate independent understanding of vocal hygiene concepts. Baseline:  Goal status: INITIAL  3.  The patient will decrease laryngeal and articulatory muscle tension by independently completing relaxation/stretching exercises. Baseline:  Goal status: INITIAL  4.  The patient will participate in 5-8 minutes conversation, maintaining average loudness of 75 dB and loud, good quality voice with min cues.    Baseline:  Goal status: INITIAL   ASSESSMENT:  CLINICAL IMPRESSION: Patient is a 57 y.o. female who was seen today for a behavioral voice treatment d/t mild dysphonia that is c/b low pitch, breathy, hoarse vocal quality.  Pt continues to perform HEP with increased understanding and ability. She does continue to experience intermittent  See details from the session in the treatment note above.   OBJECTIVE IMPAIRMENTS include voice disorder. These impairments are limiting patient from effectively communicating at home and in community. Factors affecting potential to achieve goals and functional outcome are  N/A .  Patient will benefit from skilled SLP services to address above impairments and improve overall function.  REHAB POTENTIAL: Good  PLAN: SLP FREQUENCY: 1-2x/week  SLP DURATION: 8 weeks  PLANNED INTERVENTIONS: SLP instruction and feedback and Patient/family education    Carleigh Buccieri B. Dreama Saa, M.S., CCC-SLP, Tree surgeon Certified Brain Injury Specialist [facility]  [facility] Rehabilitation Services Office [phone] Ascom [phone] Fax [phone]

## 2023-04-12 ENCOUNTER — Ambulatory Visit: Payer: BC Managed Care – PPO | Admitting: Speech Pathology

## 2023-04-12 ENCOUNTER — Encounter: Payer: Self-pay | Admitting: Family Medicine

## 2023-04-15 ENCOUNTER — Ambulatory Visit: Payer: BC Managed Care – PPO | Admitting: Speech Pathology

## 2023-04-16 ENCOUNTER — Ambulatory Visit: Payer: BC Managed Care – PPO | Admitting: Speech Pathology

## 2023-04-17 ENCOUNTER — Ambulatory Visit: Payer: BC Managed Care – PPO | Attending: Unknown Physician Specialty | Admitting: Speech Pathology

## 2023-04-17 DIAGNOSIS — R490 Dysphonia: Secondary | ICD-10-CM

## 2023-04-17 NOTE — Therapy (Signed)
OUTPATIENT SPEECH LANGUAGE PATHOLOGY  VOICE TREATMENT   Patient Name: Krista [name] MRN: [id number] DOB:[birth year], 57 y.o., female Today's Date: 04/17/2023  PCP: Aram Beecham, MD REFERRING PROVIDER: Linus Salmons, MD   End of Session - 04/17/23 1237     Visit Number 5    Number of Visits 17    Date for SLP Re-Evaluation 05/23/23    Authorization Type [employer] [facility]    Authorization - Visit Number 5    Authorization - Number of Visits 30    Progress Note Due on Visit 10    SLP Start Time 0845    SLP Stop Time  0930    SLP Time Calculation (min) 45 min    Activity Tolerance Patient tolerated treatment well             No past medical history on file. Past Surgical History:  Procedure Laterality Date   CLOSED MANIPULATION SHOULDER WITH STERIOD INJECTION Left 01/04/2021   Procedure: CLOSED MANIPULATION SHOULDER UNDER ANESTHESIA WITH STEROID INJECTION AND POSSIBLE ARTHROSCOPIC DEBRIDEMENT.;  Surgeon: Christena Flake, MD;  Location: [facility] ORS;  Service: Orthopedics;  Laterality: Left;   GANGLION CYST EXCISION     NOSE SURGERY     TUBAL LIGATION     Patient Active Problem List   Diagnosis Date Noted   Arthritis of right hip 03/13/2022   Pain of left midfoot 12/29/2021   Degenerative disc disease, lumbar 06/19/2021   Greater trochanteric bursitis of right hip 05/08/2021    ONSET DATE:  03/27/2023  REFERRING DIAG:  R49.0 (ICD-10-CM) - Hoarseness  R49.0 (ICD-10-CM) - Dysphonia    THERAPY DIAG:  Dysphonia  Rationale for Evaluation and Treatment Rehabilitation  SUBJECTIVE:   PERTINENT HISTORY and DIAGNOSTIC FINDINGS: Pt is a 57 year old female who was referred by her ENT Linus Salmons, MD) for mild dysphonia and globus sensation that developed suddenly following coughing associated with COVID in June/July 2024. Laryngoscopy on 03/26/2023 revealed "no tumor or mass seen, normal mobility, mild VC bowing."    PAIN:  Are you having pain? No   FALLS:  Has patient fallen in last 6 months? No,   LIVING ENVIRONMENT: Lives with: lives with their family Lives in: House/apartment  PLOF: Independent  PATIENT GOALS    to improve voice  SUBJECTIVE STATEMENT: "It hasn't happened again" Pt accompanied by: self  OBJECTIVE:   TODAY'S TREATMENT:  Skilled treatment session focused on pt's dysphonia goals. SLP facilitated session by providing the following interventions:   Pt reports good compliance with HEP.  Pt reports that she hasn't experience any symptoms recently.  SLP administered the Vocal Cord Dysfunction Questionnaire to further explore possibility of VCD.   Vocal Cord Dysfunction - Questionnaire (VCDQ)  The 12-item questionnaire (VCDQ) rates the impact of each question on a 5-point Likert scale (total score range 12-60) and can be used to measure changes in symptoms in patients with VCD, providing insight into which symptoms are important to patients. A score of 12 or below is considered normal. If a patient scores higher than 12, they may have vocal cord dysfunction impacting breating, quality of life.  Alycia Patten., et al. [phone]). "The VCDQ--a Questionnaire for symptom monitoring in vocal cord dysfunciton." Clin Exp Allergy 45(9): 1191-4782.   Situation My symptoms are confined to my throat/upper chest 5 = Strongly Agree I feel like I can't get breath past a certain point in my throat/upper chest because of restriction 5 = Strongly Agree My breathlessness is usually  worse when breathing in 1 = Strongly Disagree My attacks typically come on very suddenly 4 = Agree I feel that there is something in my throat that I can't clear 5 = Strongly Agree My attacks are associated with changes in my voice 5 = Strongly Agree My breathing can be noisy during attacks 1 = Strongly Disagree I'm aware of other specific triggers that cause attacks 1 = Strongly Disagree My symptoms are associated with an ache or itch in my throat 5 = Strongly  Agree I am frustrated that my symptoms have not been understood correctly 5 = Strongly Agree I am unable to tolerate any light pressure around the neck - e.g. tight clothes or bending the neck 1 = Strongly Disagree The attacks impact my social life 1 = Strongly Disagree  TOTAL 39  out of 60 Max  Pt's rating IS suggestive of vocal cord dysfunction.   Given pt's rating, SLP provided skilled written and verbal information VCD, assisted with identifying possible triggers/laryngeal irritants, and rescue breathing. Additional education provided on increasing hydration and laryngeal moisture including use of pectin based lozenge. All questions answered to pt's satisfaction.    PATIENT EDUCATION: Education details: see the above Person educated: Patient Education method: Explanation Education comprehension: verbalized understanding and needs further education   HOME EXERCISE PROGRAM: Neck stretches Vocal warm ups Record self singing     GOALS: Goals reviewed with patient? Yes  SHORT TERM GOALS: Target date: 10 sessions  The patient will maximize voice quality and loudness using breath support/oral resonance for sustained vowel production, pitch glides, and hierarchal speech drill.  Baseline: Goal status: INITIAL  2.  The patient will eliminate phonotraumatic behaviors such as chronic throat clearing, by substituting non-traumatic methods to clear mucus.  Baseline:  Goal status: INITIAL  3.  The patient will demonstrate abdominal breathing patterns and steady release of breath on exhalation to optimize efficiency of voicing and decrease laryngeal hyperfunction.  Baseline:  Goal status: INITIAL   LONG TERM GOALS: Target date: 05/23/2023  The patient will be independent for abdominal breathing and breath support exercises.  Baseline:  Goal status: INITIAL  2.  The patient will demonstrate independent understanding of vocal hygiene concepts. Baseline:  Goal status:  INITIAL  3.  The patient will decrease laryngeal and articulatory muscle tension by independently completing relaxation/stretching exercises. Baseline:  Goal status: INITIAL  4.  The patient will participate in 5-8 minutes conversation, maintaining average loudness of 75 dB and loud, good quality voice with min cues.    Baseline:  Goal status: INITIAL   ASSESSMENT:  CLINICAL IMPRESSION: Patient is a 57 y.o. female who was seen today for a behavioral voice treatment d/t mild dysphonia that is c/b low pitch, breathy, hoarse vocal quality.  Pt continues to perform HEP with increased understanding and ability. Pt presents with symptoms of vocal cord dysfunction. See details from the session in the treatment note above.   OBJECTIVE IMPAIRMENTS include voice disorder. These impairments are limiting patient from effectively communicating at home and in community. Factors affecting potential to achieve goals and functional outcome are  N/A . Patient will benefit from skilled SLP services to address above impairments and improve overall function.  REHAB POTENTIAL: Good  PLAN: SLP FREQUENCY: 1-2x/week  SLP DURATION: 8 weeks  PLANNED INTERVENTIONS: SLP instruction and feedback and Patient/family education    Arriah Wadle B. Dreama Saa, M.S., CCC-SLP, Tree surgeon Certified Brain Injury Specialist [place]  [facility] Rehabilitation Services Office [phone]  Ascom [phone] Fax [phone]

## 2023-04-19 ENCOUNTER — Ambulatory Visit: Payer: BC Managed Care – PPO | Admitting: Speech Pathology

## 2023-04-22 ENCOUNTER — Ambulatory Visit: Payer: BC Managed Care – PPO | Admitting: Speech Pathology

## 2023-04-22 DIAGNOSIS — R490 Dysphonia: Secondary | ICD-10-CM

## 2023-04-22 NOTE — Therapy (Signed)
OUTPATIENT SPEECH LANGUAGE PATHOLOGY  VOICE TREATMENT DISCHARGE SUMMARY   Patient Name: Krista [name] MRN: [id number] DOB:August 01, 1965, 57 y.o., female Today's Date: 04/22/2023  PCP: Aram Beecham, MD REFERRING PROVIDER: Linus Salmons, MD   End of Session - 04/22/23 0840     Visit Number 6    Number of Visits 17    Date for SLP Re-Evaluation 05/23/23    Authorization Type [employer] [facility]    Authorization - Visit Number 6    Authorization - Number of Visits 30    Progress Note Due on Visit 10    SLP Start Time 0845    SLP Stop Time  0930    SLP Time Calculation (min) 45 min    Activity Tolerance Patient tolerated treatment well             No past medical history on file. Past Surgical History:  Procedure Laterality Date   CLOSED MANIPULATION SHOULDER WITH STERIOD INJECTION Left 01/04/2021   Procedure: CLOSED MANIPULATION SHOULDER UNDER ANESTHESIA WITH STEROID INJECTION AND POSSIBLE ARTHROSCOPIC DEBRIDEMENT.;  Surgeon: Christena Flake, MD;  Location: [facility] ORS;  Service: Orthopedics;  Laterality: Left;   GANGLION CYST EXCISION     NOSE SURGERY     TUBAL LIGATION     Patient Active Problem List   Diagnosis Date Noted   Arthritis of right hip 03/13/2022   Pain of left midfoot 12/29/2021   Degenerative disc disease, lumbar 06/19/2021   Greater trochanteric bursitis of right hip 05/08/2021    ONSET DATE:  03/27/2023  REFERRING DIAG:  R49.0 (ICD-10-CM) - Hoarseness  R49.0 (ICD-10-CM) - Dysphonia    THERAPY DIAG:  Dysphonia  Rationale for Evaluation and Treatment Rehabilitation  SUBJECTIVE:   PERTINENT HISTORY and DIAGNOSTIC FINDINGS: Pt is a 57 year old female who was referred by her ENT Linus Salmons, MD) for mild dysphonia and globus sensation that developed suddenly following coughing associated with COVID in June/July 2024. Laryngoscopy on 03/26/2023 revealed "no tumor or mass seen, normal mobility, mild VC bowing."    PAIN:  Are you having  pain? No   FALLS: Has patient fallen in last 6 months? No,   LIVING ENVIRONMENT: Lives with: lives with their family Lives in: House/apartment  PLOF: Independent  PATIENT GOALS    to improve voice  SUBJECTIVE STATEMENT: "I sang this weekend Pt accompanied by: self  OBJECTIVE:   TODAY'S TREATMENT:  Skilled treatment session focused on pt's dysphonia goals. SLP facilitated session by providing the following interventions:   Pt reports good compliance with HEP.  Pt reports improved insight, body awareness and ability since last session  PATIENT EDUCATION: Education details: see the above Person educated: Patient Education method: Explanation Education comprehension: verbalized understanding and needs further education   HOME EXERCISE PROGRAM: Neck stretches Vocal warm ups Record self singing     GOALS: Goals reviewed with patient? Yes  SHORT TERM GOALS: Target date: 10 sessions  The patient will maximize voice quality and loudness using breath support/oral resonance for sustained vowel production, pitch glides, and hierarchal speech drill.  Baseline: Goal status: INITIAL: MET  2.  The patient will eliminate phonotraumatic behaviors such as chronic throat clearing, by substituting non-traumatic methods to clear mucus.  Baseline:  Goal status: INITIAL: MET  3.  The patient will demonstrate abdominal breathing patterns and steady release of breath on exhalation to optimize efficiency of voicing and decrease laryngeal hyperfunction.  Baseline:  Goal status: INITIAL: MET   LONG TERM GOALS: Target date:  05/23/2023  The patient will be independent for abdominal breathing and breath support exercises.  Baseline:  Goal status: INITIAL: MET  2.  The patient will demonstrate independent understanding of vocal hygiene concepts. Baseline:  Goal status: INITIAL: MET  3.  The patient will decrease laryngeal and articulatory muscle tension by independently completing  relaxation/stretching exercises. Baseline:  Goal status: INITIAL: MET  4.  The patient will participate in 5-8 minutes conversation, maintaining average loudness of 75 dB and loud, good quality voice with min cues.    Baseline:  Goal status: INITIAL: MET   ASSESSMENT:  CLINICAL IMPRESSION: Pt has made great progress over the course of skilled ST and as a result, she has met all of her goals. All education has been completed, she is singing without anxiety or instances of VCD. At this time, no further services are indicated.      PLAN:  No further services are indicated.   Caidence Kaseman B. Dreama Saa, M.S., CCC-SLP, Tree surgeon Certified Brain Injury Specialist [facility]  [facility] Rehabilitation Services Office [phone] Ascom [phone] Fax [phone]

## 2023-04-23 ENCOUNTER — Ambulatory Visit: Payer: BC Managed Care – PPO | Admitting: Speech Pathology

## 2023-04-23 ENCOUNTER — Other Ambulatory Visit: Payer: BC Managed Care – PPO

## 2023-04-25 ENCOUNTER — Ambulatory Visit: Payer: BC Managed Care – PPO | Admitting: Speech Pathology

## 2023-04-26 ENCOUNTER — Ambulatory Visit: Payer: BC Managed Care – PPO | Admitting: Speech Pathology

## 2023-04-30 ENCOUNTER — Other Ambulatory Visit: Payer: BC Managed Care – PPO

## 2023-05-03 NOTE — Progress Notes (Unsigned)
Krista [name] [address] [place] [address] Phone: [phone] Subjective:   [name], am serving as a scribe for Dr. Antoine [name].  I'm seeing this patient by the request  of:  Krista Arbour, MD  CC: Left foot pain  FUX:[id number]  04/08/2023 Patient has now what appears to be a stress fracture noted.  CAM Walker, vitamin D given, laboratory workup ordered as well.  Will see if anything is abnormal.  Patient is scheduled for an MRI in October.  Discussed that if pain is not improving she should continue with getting this done.  Discussed when patient is seated and to come out of the boot and move the ankle as we do not get significant stiffness.  Follow-up again 4 weeks      Update 05/06/2023 Krista [name] is a 57 y.o. female coming in with complaint of L foot pain. Patient states follow up on foot pain. Doing better. Would state 80% better.  Wants to get out of the boot if possible.      No past medical history on file. Past Surgical History:  Procedure Laterality Date   CLOSED MANIPULATION SHOULDER WITH STERIOD INJECTION Left 01/04/2021   Procedure: CLOSED MANIPULATION SHOULDER UNDER ANESTHESIA WITH STEROID INJECTION AND POSSIBLE ARTHROSCOPIC DEBRIDEMENT.;  Surgeon: Christena Flake, MD;  Location: [facility] ORS;  Service: Orthopedics;  Laterality: Left;   GANGLION CYST EXCISION     NOSE SURGERY     TUBAL LIGATION     Social History   Socioeconomic History   Marital status: Married    Spouse name: Not on file   Number of children: Not on file   Years of education: Not on file   Highest education level: Not on file  Occupational History   Not on file  Tobacco Use   Smoking status: Never   Smokeless tobacco: Never  Vaping Use   Vaping status: Never Used  Substance and Sexual Activity   Alcohol use: Never   Drug use: Never   Sexual activity: Not on file  Other Topics Concern   Not on file  Social History Narrative   Not  on file   Social Determinants of Health   Financial Resource Strain: Low Risk  (04/03/2023)   [facility]   Overall Financial Resource Strain (CARDIA)    Difficulty of Paying Living Expenses: Not hard at all  Food Insecurity: No Food Insecurity (04/03/2023)   Received from [facility]   Hunger Vital Sign    Worried About Running Out of Food in the Last Year: Never true    Ran Out of Food in the Last Year: Never true  Transportation Needs: No Transportation Needs (04/03/2023)   Received from [facility] - Transportation    In the past 12 months, has lack of transportation kept you from medical appointments or from getting medications?: No    Lack of Transportation (Non-Medical): No  Physical Activity: Not on file  Stress: Not on file  Social Connections: Not on file   No Known Allergies No family history on file.     Current Outpatient Medications (Analgesics):    celecoxib (CELEBREX) 200 MG capsule, Take 200 mg by mouth 2 (two) times daily.   Current Outpatient Medications (Other):    COLLAGEN PO, Take 1 Scoop by mouth daily.   methocarbamol (ROBAXIN) 750 MG tablet, Take 750 mg by mouth every evening.  Vitamin D, Ergocalciferol, (DRISDOL) 1.25 MG (50000 UNIT) CAPS capsule, Take 1 capsule (50,000 Units total) by mouth every 7 (seven) days.   Reviewed prior external information including notes and imaging from  primary care provider As well as notes that were available from care everywhere and other healthcare systems.  Past medical history, social, surgical and family history all reviewed in electronic medical record.  No pertanent information unless stated regarding to the chief complaint.   Review of Systems:  No headache, visual changes, nausea, vomiting, diarrhea, constipation, dizziness, abdominal pain, skin rash, fevers, chills, night sweats, weight loss, swollen lymph nodes, body aches,  joint swelling, chest pain, shortness of breath, mood changes. POSITIVE muscle aches  Objective  Blood pressure 124/84, pulse 90, height 5\' 6"  (1.676 m), SpO2 98%.   General: No apparent distress alert and oriented x3 mood and affect normal, dressed appropriately.  HEENT: Pupils equal, extraocular movements intact  Respiratory: Patient's speak in full sentences and does not appear short of breath  Cardiovascular: No lower extremity edema, non tender, no erythema  Left foot exam still shows some swelling and some tenderness over the third and fourth metatarsals proximally.  Otherwise seems to be doing relatively well.  Great range of motion of the ankle noted.  Limited muscular skeletal ultrasound was performed and interpreted by Krista [name], M   Limited ultrasound shows no cortical irregularity noted of the bone itself.  Does have what appears to be some arthritic changes though noted of the midfoot.  Procedure: Real-time Ultrasound Guided Injection of left midfoot at the third fourth metatarsal Device: GE Logiq Q7 Ultrasound guided injection is preferred based studies that show increased duration, increased effect, greater accuracy, decreased procedural pain, increased response rate, and decreased cost with ultrasound guided versus blind injection.  Verbal informed consent obtained.  Time-out conducted.  Noted no overlying erythema, induration, or other signs of local infection.  Skin prepped in a sterile fashion.  Local anesthesia: Topical Ethyl chloride.  With sterile technique and under real time ultrasound guidance: With a 25-gauge half inch needle injected with 0.5 cc of 0.5% Marcaine and 0.5 cc of Kenalog 40 mg/mL Completed without difficulty  Pain immediately resolved suggesting accurate placement of the medication.  Advised to call if fevers/chills, erythema, induration, drainage, or persistent bleeding.  Impression: Technically successful ultrasound guided injection.     Impression and Recommendations:     The above documentation has been reviewed and is accurate and complete [name], DO

## 2023-05-06 ENCOUNTER — Ambulatory Visit (INDEPENDENT_AMBULATORY_CARE_PROVIDER_SITE_OTHER): Payer: BC Managed Care – PPO | Admitting: Family Medicine

## 2023-05-06 ENCOUNTER — Encounter: Payer: Self-pay | Admitting: Family Medicine

## 2023-05-06 ENCOUNTER — Other Ambulatory Visit: Payer: Self-pay

## 2023-05-06 VITALS — BP 124/84 | HR 90 | Ht 66.0 in

## 2023-05-06 DIAGNOSIS — M79672 Pain in left foot: Secondary | ICD-10-CM

## 2023-05-06 NOTE — Assessment & Plan Note (Signed)
Repeat injection given today, tolerated the procedure well, discussed icing regimen and home exercises.  Increase activity slowly.  Follow-up with me again in 6 to 8 weeks.  Continue the good shoes.

## 2023-05-06 NOTE — Patient Instructions (Signed)
See you again in 2-3 months Wear good shoes Continue Vit D Injection in foot today

## 2023-07-01 ENCOUNTER — Ambulatory Visit: Payer: BC Managed Care – PPO | Admitting: Family Medicine

## 2023-08-11 IMAGING — DX DG KNEE 3 VIEWS*R*
3 series · 3 of 3 positions shown · non-contrast
Comparison: None.

CLINICAL DATA: Intermittent right knee pain

EXAM:
RIGHT KNEE - 3 VIEW

[knee ap]
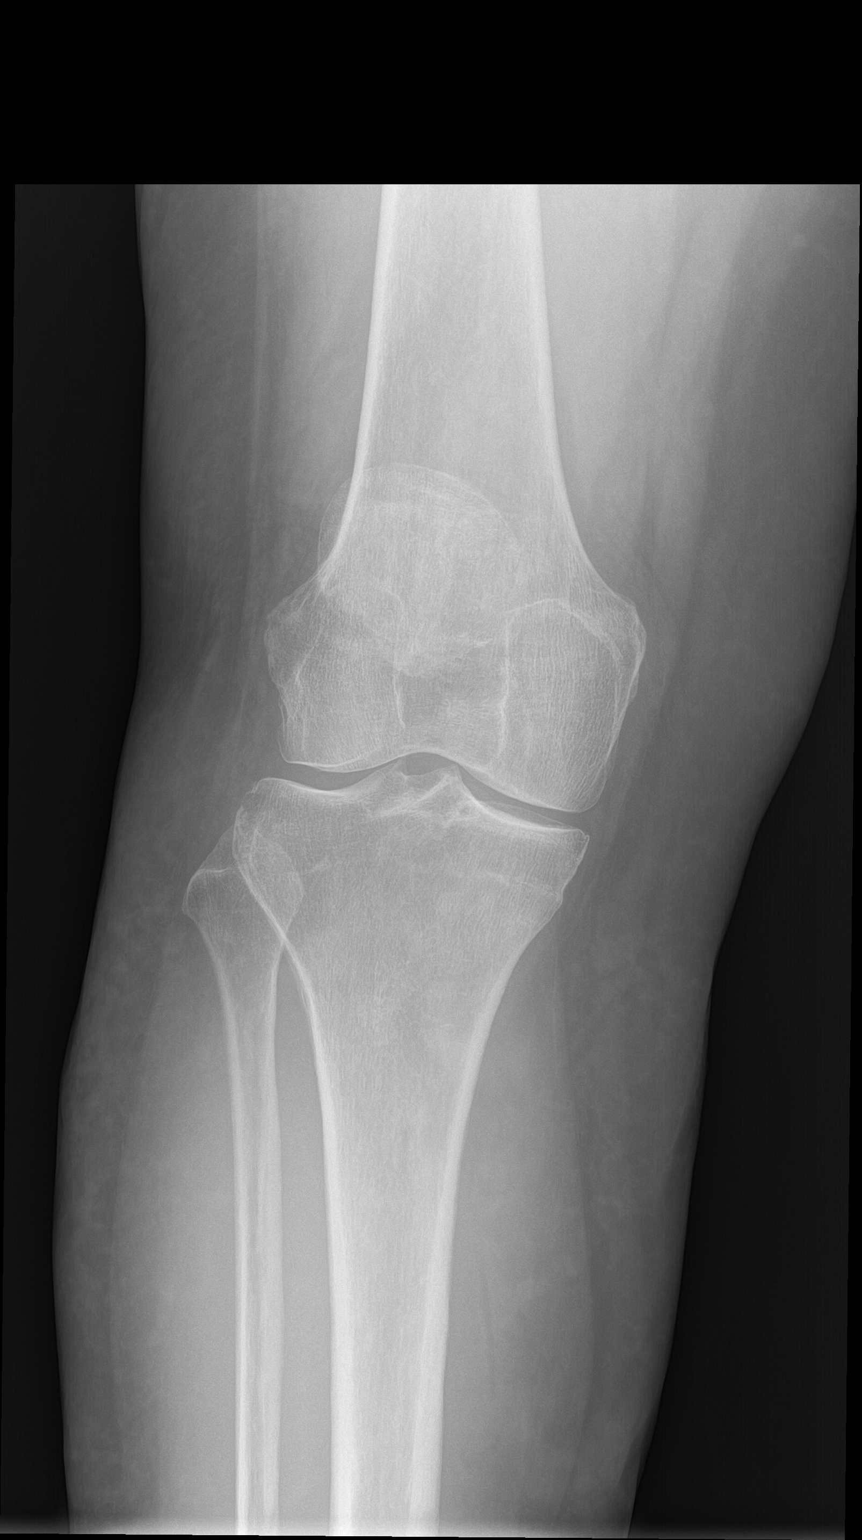

[knee lat]
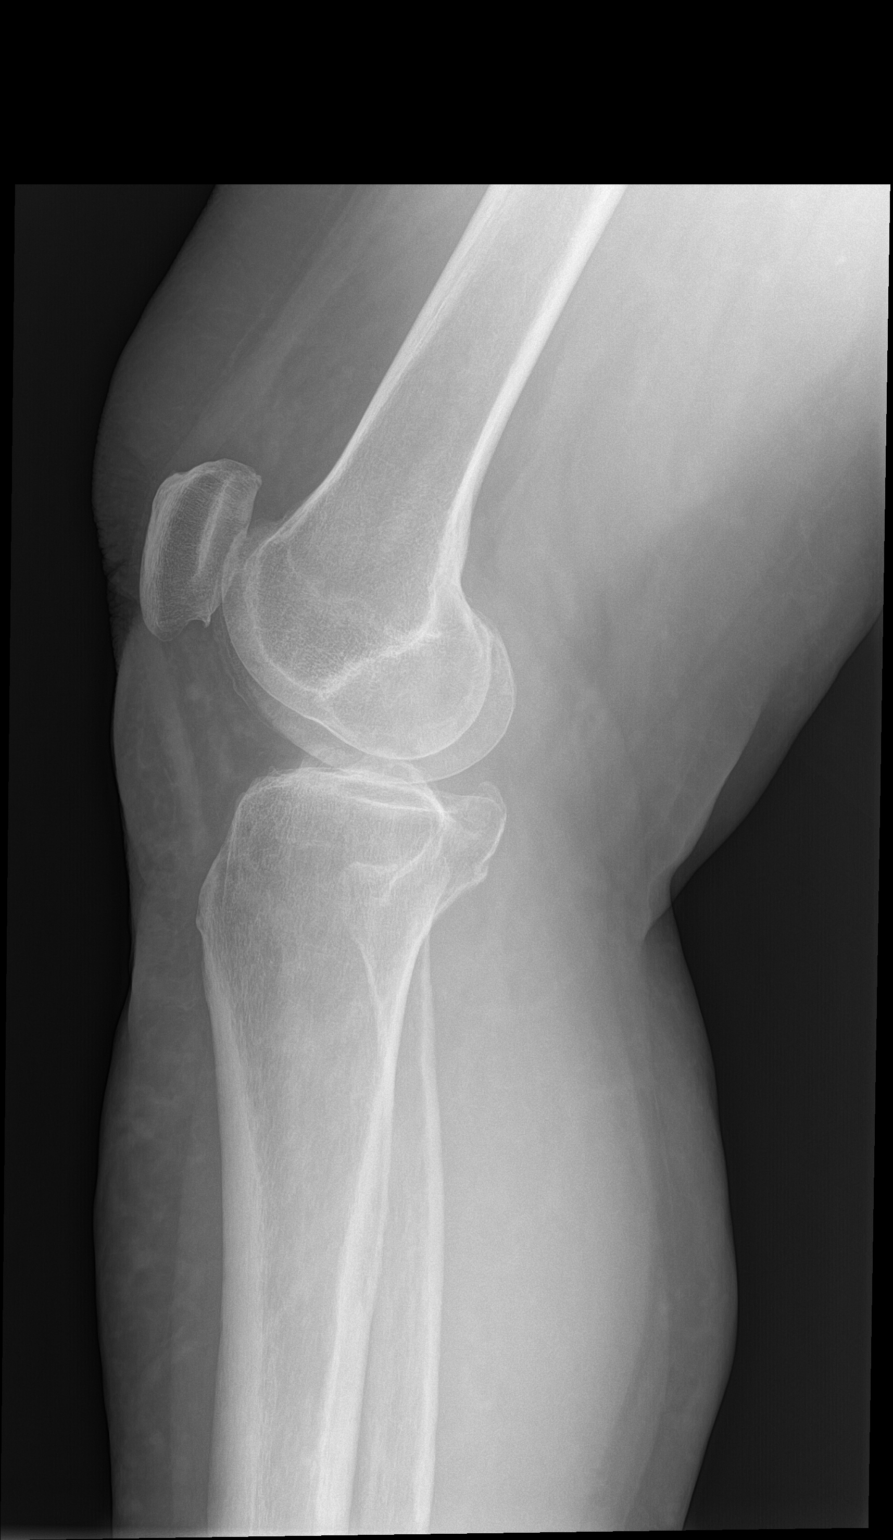

[patella]
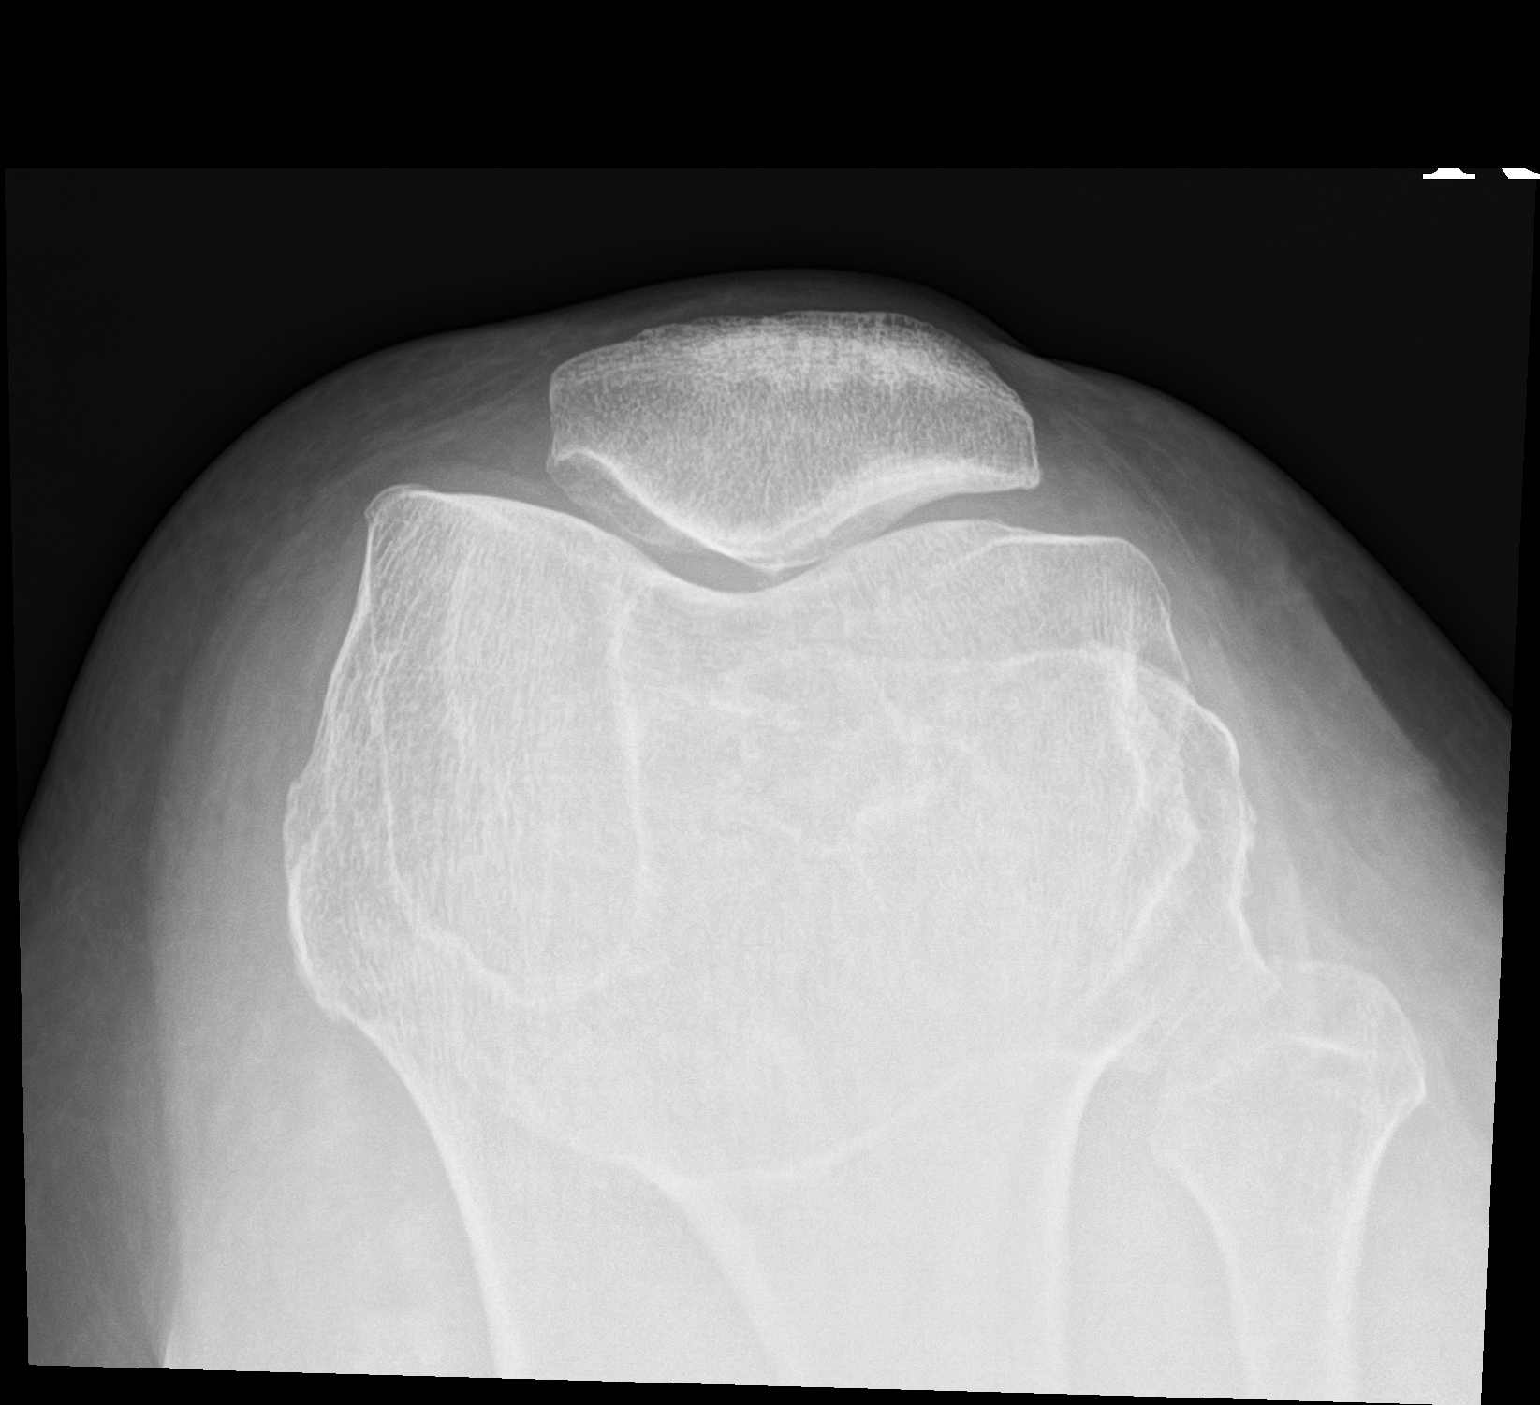

[3 of 3 positions shown; findings below may reference images not displayed]

FINDINGS: No evidence of fracture, dislocation, or joint effusion. No evidence
of arthropathy or other focal bone abnormality. Soft tissues are
unremarkable.
IMPRESSION: Negative.

## 2023-08-21 ENCOUNTER — Ambulatory Visit: Payer: BC Managed Care – PPO | Admitting: Family Medicine

## 2023-08-21 ENCOUNTER — Other Ambulatory Visit: Payer: Self-pay

## 2023-08-21 VITALS — BP 108/68 | HR 79 | Ht 66.0 in | Wt 191.0 lb

## 2023-08-21 DIAGNOSIS — M79672 Pain in left foot: Secondary | ICD-10-CM | POA: Diagnosis not present

## 2023-08-21 NOTE — Patient Instructions (Signed)
 Injection today Heel Lift See you again in 2 months

## 2023-08-21 NOTE — Progress Notes (Signed)
 [facility] [address] [place] [address] Phone: [phone] Subjective:   ISusannah [name], am serving as a scribe for Dr. Arthea Claudene.  I'm seeing this patient by the request  of:  Auston Reyes BIRCH, MD  CC: Foot pain follow-up  YEP:Dlagzrupcz  Ariadna A Pedro is a 58 y.o. female coming in with complaint of left foot pain.  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  Patient was given an injection at that time.  Patient states starting to have issues again. End of Jan put on different shoes and has been causing pain since. Putting weight on hit causes the pain.        No past medical history on file. Past Surgical History:  Procedure Laterality Date   CLOSED MANIPULATION SHOULDER WITH STERIOD INJECTION Left 01/04/2021   Procedure: CLOSED MANIPULATION SHOULDER UNDER ANESTHESIA WITH STEROID INJECTION AND POSSIBLE ARTHROSCOPIC DEBRIDEMENT.;  Surgeon: Edie Norleen PARAS, MD;  Location: [facility] ORS;  Service: Orthopedics;  Laterality: Left;   GANGLION CYST EXCISION     NOSE SURGERY     TUBAL LIGATION     Social History   Socioeconomic History   Marital status: Married    Spouse name: Not on file   Number of children: Not on file   Years of education: Not on file   Highest education level: Not on file  Occupational History   Not on file  Tobacco Use   Smoking status: Never   Smokeless tobacco: Never  Vaping Use   Vaping status: Never Used  Substance and Sexual Activity   Alcohol use: Never   Drug use: Never   Sexual activity: Not on file  Other Topics Concern   Not on file  Social History Narrative   Not on file   Social Drivers of Health   Financial Resource Strain: Low Risk  (04/03/2023)   Received from [facility]   Overall Financial Resource Strain (CARDIA)    Difficulty of Paying Living Expenses: Not hard at all  Food Insecurity: No Food Insecurity (04/03/2023)    Received from [facility]   Hunger Vital Sign    Worried About Running Out of Food in the Last Year: Never true    Ran Out of Food in the Last Year: Never true  Transportation Needs: No Transportation Needs (04/03/2023)   Received from [facility] - Transportation    In the past 12 months, has lack of transportation kept you from medical appointments or from getting medications?: No    Lack of Transportation (Non-Medical): No  Physical Activity: Not on file  Stress: Not on file  Social Connections: Not on file   No Known Allergies No family history on file.     Current Outpatient Medications (Analgesics):    celecoxib (CELEBREX) 200 MG capsule, Take 200 mg by mouth 2 (two) times daily.   Current Outpatient Medications (Other):    COLLAGEN PO, Take 1 Scoop by mouth daily.   methocarbamol (ROBAXIN) 750 MG tablet, Take 750 mg by mouth every evening.   Vitamin D , Ergocalciferol , (DRISDOL ) 1.25 MG (50000 UNIT) CAPS capsule, Take 1 capsule (50,000 Units total) by mouth every 7 (seven) days.   Reviewed prior external information including notes and imaging from  primary care provider As well as notes that were available from care everywhere and other healthcare systems.  Past medical history, social, surgical and  family history all reviewed in electronic medical record.  No pertanent information unless stated regarding to the chief complaint.   Review of Systems:  No headache, visual changes, nausea, vomiting, diarrhea, constipation, dizziness, abdominal pain, skin rash, fevers, chills, night sweats, weight loss, swollen lymph nodes, body aches, joint swelling, chest pain, shortness of breath, mood changes. POSITIVE muscle aches  Objective  There were no vitals taken for this visit.   General: No apparent distress alert and oriented x3 mood and affect normal, dressed appropriately.  HEENT: Pupils equal, extraocular movements intact   Respiratory: Patient's speak in full sentences and does not appear short of breath  Cardiovascular: No lower extremity edema, non tender, no erythema  Left knee exam shows nothing significant.  Unfortunately patient does have more left foot pain.  Seems to be around the third fourth and fifth metatarsals proximally.  Mild swelling noted.  Procedure: Real-time Ultrasound Guided Injection of left midfoot injection between the middle and lateral cuneiform Device: GE Logiq Q7 Ultrasound guided injection is preferred based studies that show increased duration, increased effect, greater accuracy, decreased procedural pain, increased response rate, and decreased cost with ultrasound guided versus blind injection.  Verbal informed consent obtained.  Time-out conducted.  Noted no overlying erythema, induration, or other signs of local infection.  Skin prepped in a sterile fashion.  Local anesthesia: Topical Ethyl chloride.  With sterile technique and under real time ultrasound guidance: With a 25-gauge half inch needle injected with 0.5 cc of 0.5% Marcaine  and 0.5 cc of Kenalog  40 mg/mL Completed without difficulty  Pain immediately resolved suggesting accurate placement of the medication.  Images saved in patient's chart Advised to call if fevers/chills, erythema, induration, drainage, or persistent bleeding.  Impression: Technically successful ultrasound guided injection.    Impression and Recommendations:    The above documentation has been reviewed and is accurate and complete Yentl Verge M Kimiyo Carmicheal, DO

## 2023-08-22 ENCOUNTER — Encounter: Payer: Self-pay | Admitting: Family Medicine

## 2023-08-22 NOTE — Assessment & Plan Note (Signed)
 Discussed with patient to continue to wear good shoes, icing regimen and home exercises.  Worsening pain need to consider the possibility of advanced imaging but do not think it would change medical management.  Patient is in agreement of the plan.  Will continue to make changes where possible.  We need to consider other medications if this continues.  Follow-up again 3 months

## 2023-08-27 ENCOUNTER — Encounter: Payer: Self-pay | Admitting: Family Medicine

## 2023-09-26 ENCOUNTER — Encounter: Payer: Self-pay | Admitting: Family Medicine

## 2023-09-27 MED ORDER — TIZANIDINE HCL 4 MG PO TABS: 4.0000 mg | ORAL_TABLET | Freq: Every evening | ORAL | 2 refills | Status: AC

## 2023-10-21 NOTE — Progress Notes (Unsigned)
 [name] [address] [place] [address] Phone: [phone] Subjective:   [name], am serving as a scribe for Dr. Antoine Primas.  I'm seeing this patient by the request  of:  Marguarite Arbour, MD  CC: foot and shoulder pain follow up   BJY:[id number]  08/21/2023 Discussed with patient to continue to wear good shoes, icing regimen and home exercises.  Worsening pain need to consider the possibility of advanced imaging but do not think it would change medical management.  Patient is in agreement of the plan.  Will continue to make changes where possible.  We need to consider other medications if this continues.  Follow-up again 3 months      Update 10/22/2023 Krista [name] is a 58 y.o. female coming in with complaint of L midfoot pain. Patient states that her foot is doing better. Pain with a lot of walking but doing ok.  L shoulder pain over past few weeks. Has been doing weight training since November and has pain in distal deltoid with ER.        No past medical history on file. Past Surgical History:  Procedure Laterality Date   CLOSED MANIPULATION SHOULDER WITH STERIOD INJECTION Left 01/04/2021   Procedure: CLOSED MANIPULATION SHOULDER UNDER ANESTHESIA WITH STEROID INJECTION AND POSSIBLE ARTHROSCOPIC DEBRIDEMENT.;  Surgeon: Christena Flake, MD;  Location: [facility] ORS;  Service: Orthopedics;  Laterality: Left;   GANGLION CYST EXCISION     NOSE SURGERY     TUBAL LIGATION     Social History   Socioeconomic History   Marital status: Married    Spouse name: Not on file   Number of children: Not on file   Years of education: Not on file   Highest education level: Not on file  Occupational History   Not on file  Tobacco Use   Smoking status: Never   Smokeless tobacco: Never  Vaping Use   Vaping status: Never Used  Substance and Sexual Activity   Alcohol use: Never   Drug use: Never   Sexual activity: Not on file  Other Topics  Concern   Not on file  Social History Narrative   Not on file   Social Drivers of Health   Financial Resource Strain: Low Risk  (04/03/2023)   Received from [facility]   Overall Financial Resource Strain (CARDIA)    Difficulty of Paying Living Expenses: Not hard at all  Food Insecurity: No Food Insecurity (04/03/2023)   Received from [facility]   Hunger Vital Sign    Worried About Running Out of Food in the Last Year: Never true    Ran Out of Food in the Last Year: Never true  Transportation Needs: No Transportation Needs (04/03/2023)   Received from [facility] - Transportation    In the past 12 months, has lack of transportation kept you from medical appointments or from getting medications?: No    Lack of Transportation (Non-Medical): No  Physical Activity: Not on file  Stress: Not on file  Social Connections: Not on file   No Known Allergies No family history on file.       Current Outpatient Medications (Other):    COLLAGEN PO, Take 1 Scoop by mouth daily.   tiZANidine (ZANAFLEX) 4 MG tablet, Take 1 tablet (4 mg total) by mouth Nightly.   Vitamin D, Ergocalciferol, (DRISDOL) 1.25 MG (50000 UNIT) CAPS capsule, Take 1  capsule (50,000 Units total) by mouth every 7 (seven) days.   Reviewed prior external information including notes and imaging from  primary care provider As well as notes that were available from care everywhere and other healthcare systems.  Past medical history, social, surgical and family history all reviewed in electronic medical record.  No pertanent information unless stated regarding to the chief complaint.   Review of Systems:  No headache, visual changes, nausea, vomiting, diarrhea, constipation, dizziness, abdominal pain, skin rash, fevers, chills, night sweats, weight loss, swollen lymph nodes, body aches, joint swelling, chest pain, shortness of breath, mood changes. POSITIVE  muscle aches  Objective  Blood pressure 110/74, pulse 64, height 5\' 6"  (1.676 m), weight 191 lb (86.6 kg), SpO2 94%.   General: No apparent distress alert and oriented x3 mood and affect normal, dressed appropriately.  HEENT: Pupils equal, extraocular movements intact  Respiratory: Patient's speak in full sentences and does not appear short of breath  Cardiovascular: No lower extremity edema, non tender, no erythema  Left shoulder exam shows patient does have severe tenderness to palpation over the lateral humeral area.  No swelling noted though.  Patient does have significant voluntary guarding noted with external range of motion.  Positive impingement with Neer and Hawkins.  Rotator cuff strength intact Left foot exam shows unremarkable on today except for the arthritic changes noted.  Limited muscular skeletal ultrasound was performed and interpreted by Antoine Primas, M  Limited ultrasound of patient's shoulder shows rotator cuff is intact.  Hypoechoic changes of the bicep tendon noted anteriorly.  No true tear of the tendon itself though.  Anterior labrum difficult to see.  Patient does have a cortical irregularity noted of the most lateral aspect of the humerus.  Increasing in neovascularization in the area.  Patient's acromioclavicular joint does have hypoechoic changes that is fairly consistent with appears to be acute in nature.  Some increase in Doppler flow noted.   [id number]; 15 additional minutes spent for Therapeutic exercises as stated in above notes.  This included exercises focusing on stretching, strengthening, with significant focus on eccentric aspects.   Long term goals include an improvement in range of motion, strength, endurance as well as avoiding reinjury. Patient's frequency would include in 1-2 times a day, 3-5 times a week for a duration of 6-12 weeks. Shoulder Exercises that included:  Basic scapular stabilization to include adduction and depression of scapula Scaption,  focusing on proper movement and good control Internal and External rotation utilizing a theraband, with elbow tucked at side entire time Rows with theraband    Proper technique shown and discussed handout in great detail with ATC.  All questions were discussed and answered.     Impression and Recommendations:    The above documentation has been reviewed and is accurate and complete [name], DO

## 2023-10-22 ENCOUNTER — Other Ambulatory Visit: Payer: Self-pay

## 2023-10-22 ENCOUNTER — Encounter: Payer: Self-pay | Admitting: Family Medicine

## 2023-10-22 ENCOUNTER — Ambulatory Visit: Payer: BC Managed Care – PPO | Admitting: Family Medicine

## 2023-10-22 ENCOUNTER — Ambulatory Visit (INDEPENDENT_AMBULATORY_CARE_PROVIDER_SITE_OTHER)

## 2023-10-22 VITALS — BP 110/74 | HR 64 | Ht 66.0 in | Wt 191.0 lb

## 2023-10-22 DIAGNOSIS — M79672 Pain in left foot: Secondary | ICD-10-CM | POA: Diagnosis not present

## 2023-10-22 DIAGNOSIS — G8929 Other chronic pain: Secondary | ICD-10-CM

## 2023-10-22 DIAGNOSIS — M859 Disorder of bone density and structure, unspecified: Secondary | ICD-10-CM

## 2023-10-22 DIAGNOSIS — M25512 Pain in left shoulder: Secondary | ICD-10-CM | POA: Diagnosis not present

## 2023-10-22 MED ORDER — VITAMIN D (ERGOCALCIFEROL) 1.25 MG (50000 UNIT) PO CAPS: 50000.0000 [IU] | ORAL_CAPSULE | ORAL | 0 refills | Status: AC

## 2023-10-22 NOTE — Telephone Encounter (Signed)
 Scheduled

## 2023-10-22 NOTE — Patient Instructions (Addendum)
 Xray today Once weekly Vit D No lifting heavy until we see you in 5-6 weeks Keep hands in peripheral vision

## 2023-10-22 NOTE — Assessment & Plan Note (Signed)
 Left shoulder exam does have chronic difficulties.  This seems to be multifactorial.  There is a potential cortical irregularity noted of the proximal humerus so we will get an x-ray but the patient is not in that type of pain but does have significant limitation in external range of motion that is concerning.  Patient on ultrasound does have significant hypoechoic changes noted of the acromioclavicular joint that does seem to be more consistent with acute sprain.  We discussed different treatment options including the possibility of injection which patient declined.  Patient does have the past medical history significant for manipulation of the shoulder and injection previously.  Monitor patient's treatment and given home exercises and follow-up with me again in 6 to 8 weeks otherwise.

## 2023-10-23 ENCOUNTER — Encounter: Payer: Self-pay | Admitting: Family Medicine

## 2023-10-24 ENCOUNTER — Ambulatory Visit (INDEPENDENT_AMBULATORY_CARE_PROVIDER_SITE_OTHER)
Admission: RE | Admit: 2023-10-24 | Discharge: 2023-10-24 | Disposition: A | Discharge status: Home / Self Care | Source: Ambulatory Visit

## 2023-10-24 DIAGNOSIS — M859 Disorder of bone density and structure, unspecified: Secondary | ICD-10-CM | POA: Diagnosis not present

## 2023-10-29 ENCOUNTER — Encounter: Payer: Self-pay | Admitting: Family Medicine

## 2023-11-18 ENCOUNTER — Encounter: Payer: Self-pay | Admitting: Family Medicine

## 2023-11-29 NOTE — Progress Notes (Signed)
 [facility] [address] [place] [address] Phone: [phone] Subjective:   Delwyn Filippo, am serving as a scribe for Dr. Ronnell Coins.  I'm seeing this patient by the request  of:  Yehuda Helms, MD  CC: Left foot  BJY:[id number]  10/22/2023 Left shoulder exam does have chronic difficulties.  This seems to be multifactorial.  There is a potential cortical irregularity noted of the proximal humerus so we will get an x-ray but the patient is not in that type of pain but does have significant limitation in external range of motion that is concerning.  Patient on ultrasound does have significant hypoechoic changes noted of the acromioclavicular joint that does seem to be more consistent with acute sprain.  We discussed different treatment options including the possibility of injection which patient declined.  Patient does have the past medical history significant for manipulation of the shoulder and injection previously.  Monitor patient's treatment and given home exercises and follow-up with me again in 6 to 8 weeks otherwise.     Update 12/03/2023 MULKI ROESLER is a 58 y.o. female coming in with complaint of L foot pain. Here for PRP today. Patient states that she went to the beach and walked often without shoes. Pain over the top lateral portion of midfoot.   Patient has skin irritation for past 4-6 weeks throughout her body but especially bad on her R foot.        No past medical history on file. Past Surgical History:  Procedure Laterality Date   CLOSED MANIPULATION SHOULDER WITH STERIOD INJECTION Left 01/04/2021   Procedure: CLOSED MANIPULATION SHOULDER UNDER ANESTHESIA WITH STEROID INJECTION AND POSSIBLE ARTHROSCOPIC DEBRIDEMENT.;  Surgeon: Elner Hahn, MD;  Location: [facility] ORS;  Service: Orthopedics;  Laterality: Left;   GANGLION CYST EXCISION     NOSE SURGERY     TUBAL LIGATION     Social History   Socioeconomic History   Marital  status: Married    Spouse name: Not on file   Number of children: Not on file   Years of education: Not on file   Highest education level: Not on file  Occupational History   Not on file  Tobacco Use   Smoking status: Never   Smokeless tobacco: Never  Vaping Use   Vaping status: Never Used  Substance and Sexual Activity   Alcohol use: Never   Drug use: Never   Sexual activity: Not on file  Other Topics Concern   Not on file  Social History Narrative   Not on file   Social Drivers of Health   Financial Resource Strain: Low Risk  (04/03/2023)   Received from [facility]   Overall Financial Resource Strain (CARDIA)    Difficulty of Paying Living Expenses: Not hard at all  Food Insecurity: No Food Insecurity (04/03/2023)   Received from [facility]   Hunger Vital Sign    Worried About Running Out of Food in the Last Year: Never true    Ran Out of Food in the Last Year: Never true  Transportation Needs: No Transportation Needs (04/03/2023)   Received from [facility] - Transportation    In the past 12 months, has lack of transportation kept you from medical appointments or from getting medications?: No    Lack of Transportation (Non-Medical): No  Physical Activity: Not on file  Stress: Not on file  Social Connections: Not on  file   No Known Allergies No family history on file.       Current Outpatient Medications (Other):    COLLAGEN PO, Take 1 Scoop by mouth daily.   doxycycline (VIBRA-TABS) 100 MG tablet, Take 1 tablet (100 mg total) by mouth 2 (two) times daily.   fluconazole (DIFLUCAN) 200 MG tablet, Take 1 tablet (200 mg total) by mouth daily.   tiZANidine  (ZANAFLEX ) 4 MG tablet, Take 1 tablet (4 mg total) by mouth Nightly.   Vitamin D , Ergocalciferol , (DRISDOL ) 1.25 MG (50000 UNIT) CAPS capsule, Take 1 capsule (50,000 Units total) by mouth every 7 (seven) days.   Reviewed prior external information  including notes and imaging from  primary care provider As well as notes that were available from care everywhere and other healthcare systems.  Past medical history, social, surgical and family history all reviewed in electronic medical record.  No pertanent information unless stated regarding to the chief complaint.   Review of Systems:  No headache, visual changes, nausea, vomiting, diarrhea, constipation, dizziness, abdominal pain, skin rash, fevers, chills, night sweats, weight loss, swollen lymph nodes, body aches, joint swelling, chest pain, shortness of breath, mood changes. POSITIVE muscle aches  Objective  Blood pressure 108/78, pulse 72, height 5\' 6"  (1.676 m), weight 191 lb (86.6 kg), SpO2 96%.   General: No apparent distress alert and oriented x3 mood and affect normal, dressed appropriately.  HEENT: Pupils equal, extraocular movements intact  Respiratory: Patient's speak in full sentences and does not appear short of breath  Cardiovascular: No lower extremity edema, non tender, no erythema  Patient's right foot does have what appears to be an infection with what appears to have a fairly demarcated red ring around it.  This is on the dorsal aspect of the foot.  Some mild small patches noted on the calf.  Some mild on the forearms as well but nothing as enlarged or irritated as the foot.  Procedure: Real-time Ultrasound Guided Injection of the midfoot Device: GE Logiq Q7 Ultrasound guided injection is preferred based studies that show increased duration, increased effect, greater accuracy, decreased procedural pain, increased response rate, and decreased cost with ultrasound guided versus blind injection.  Verbal informed consent obtained.  Time-out conducted.  Noted no overlying erythema, induration, or other signs of local infection.  Skin prepped in a sterile fashion.  Local anesthesia: Topical Ethyl chloride.  With sterile technique and under real time ultrasound guidance:  With a 21-gauge 1-1/2 inch needle injected with 0.5 cc of 0.5% Marcaine  and then injected with 3 cc of PRP between the cuneiform and navicular bone. Completed without difficulty  Pain immediately resolved suggesting accurate placement of the medication.  Advised to call if fevers/chills, erythema, induration, drainage, or persistent bleeding.  Images saved Impression: Technically successful ultrasound guided injection.    Impression and Recommendations:    The above documentation has been reviewed and is accurate and complete Carollynn Pennywell M Ivy Puryear, DO

## 2023-12-03 ENCOUNTER — Ambulatory Visit: Payer: Self-pay | Admitting: Family Medicine

## 2023-12-03 ENCOUNTER — Encounter: Payer: Self-pay | Admitting: Family Medicine

## 2023-12-03 ENCOUNTER — Other Ambulatory Visit: Payer: Self-pay

## 2023-12-03 VITALS — BP 108/78 | HR 72 | Ht 66.0 in | Wt 191.0 lb

## 2023-12-03 DIAGNOSIS — L97511 Non-pressure chronic ulcer of other part of right foot limited to breakdown of skin: Secondary | ICD-10-CM

## 2023-12-03 DIAGNOSIS — L089 Local infection of the skin and subcutaneous tissue, unspecified: Secondary | ICD-10-CM | POA: Insufficient documentation

## 2023-12-03 DIAGNOSIS — M79672 Pain in left foot: Secondary | ICD-10-CM

## 2023-12-03 MED ORDER — DOXYCYCLINE HYCLATE 100 MG PO TABS: 100.0000 mg | ORAL_TABLET | Freq: Two times a day (BID) | ORAL | 0 refills | Status: DC

## 2023-12-03 MED ORDER — FLUCONAZOLE 200 MG PO TABS: 200.0000 mg | ORAL_TABLET | Freq: Every day | ORAL | 0 refills | Status: DC

## 2023-12-03 NOTE — Patient Instructions (Addendum)
 No ice or IBU for 3 days Heat and Tylenol are ok [facility] will call you Doxy BID 7 days Diflucan 200mg  daily for 7 days If gets worse seek medical attention See me again in 6-8 weeks

## 2023-12-03 NOTE — Assessment & Plan Note (Signed)
 Patient given injection and tolerated the procedure well, we discussed with patient about PRP protocol afterwards.  Follow-up again in 6 to 8 weeks to further evaluate

## 2023-12-03 NOTE — Assessment & Plan Note (Signed)
 Appears to be on the foot.  Does have a reading that makes me concerned for may be fungal.  Patient's husband also is having significant difficulty with different skin changes.  They are on well water and encouraged him to get it potentially checked.  Follow-up with me again later date.  Doxycycline and Flagyl given.

## 2023-12-05 ENCOUNTER — Ambulatory Visit: Admitting: Family Medicine

## 2023-12-16 ENCOUNTER — Other Ambulatory Visit: Payer: Self-pay

## 2023-12-16 MED ORDER — FLUCONAZOLE 200 MG PO TABS
200.0000 mg | ORAL_TABLET | Freq: Every day | ORAL | 0 refills | Status: AC
Start: 2023-12-16 — End: ?

## 2023-12-16 MED ORDER — DOXYCYCLINE HYCLATE 100 MG PO TABS: 100.0000 mg | ORAL_TABLET | Freq: Two times a day (BID) | ORAL | 0 refills | Status: AC

## 2024-01-01 ENCOUNTER — Ambulatory Visit: Admitting: Physician Assistant

## 2024-01-14 ENCOUNTER — Ambulatory Visit: Admitting: Family Medicine

## 2024-02-05 NOTE — Progress Notes (Unsigned)
 [facility] [address] [place] [address] Phone: [phone] Subjective:   ISusannah [name], am serving as a scribe for Dr. Arthea Claudene.  I'm seeing this patient by the request  of:  Auston Reyes BIRCH, MD  CC: left shoulder pain   YEP:Dlagzrupcz  12/03/2023 Appears to be on the foot.  Does have a reading that makes me concerned for may be fungal.  Patient's husband also is having significant difficulty with different skin changes.  They are on well water and encouraged him to get it potentially checked.  Follow-up with me again later date.  Doxycycline  and Flagyl given.     02/07/2024 Krista [name] is a 57 y.o. female coming in with complaint of L foot and L shoulder pain. Patient states foot is doing better. Pain not as intense as it was before. Shoulder is still bother some. Pain with ER. Can't sleep on the L side. Has slowed down on weight training because of pain.      No past medical history on file. Past Surgical History:  Procedure Laterality Date   CLOSED MANIPULATION SHOULDER WITH STERIOD INJECTION Left 01/04/2021   Procedure: CLOSED MANIPULATION SHOULDER UNDER ANESTHESIA WITH STEROID INJECTION AND POSSIBLE ARTHROSCOPIC DEBRIDEMENT.;  Surgeon: Edie Norleen PARAS, MD;  Location: [facility] ORS;  Service: Orthopedics;  Laterality: Left;   GANGLION CYST EXCISION     NOSE SURGERY     TUBAL LIGATION     Social History   Socioeconomic History   Marital status: Married    Spouse name: Not on file   Number of children: Not on file   Years of education: Not on file   Highest education level: Not on file  Occupational History   Not on file  Tobacco Use   Smoking status: Never   Smokeless tobacco: Never  Vaping Use   Vaping status: Never Used  Substance and Sexual Activity   Alcohol use: Never   Drug use: Never   Sexual activity: Not on file  Other Topics Concern   Not on file  Social History Narrative   Not on file   Social Drivers of  Health   Financial Resource Strain: Low Risk  (04/03/2023)   Received from [facility]   Overall Financial Resource Strain (CARDIA)    Difficulty of Paying Living Expenses: Not hard at all  Food Insecurity: No Food Insecurity (04/03/2023)   Received from [facility]   Hunger Vital Sign    Within the past 12 months, you worried that your food would run out before you got the money to buy more.: Never true    Within the past 12 months, the food you bought just didn't last and you didn't have money to get more.: Never true  Transportation Needs: No Transportation Needs (04/03/2023)   Received from [facility] - Transportation    In the past 12 months, has lack of transportation kept you from medical appointments or from getting medications?: No    Lack of Transportation (Non-Medical): No  Physical Activity: Not on file  Stress: Not on file  Social Connections: Not on file   No Known Allergies No family history on file.       Current Outpatient Medications (Other):    COLLAGEN PO, Take 1 Scoop by mouth daily.   doxycycline  (VIBRA -TABS) 100 MG tablet, Take 1 tablet (100 mg total) by mouth 2 (two) times daily.   fluconazole  (  DIFLUCAN ) 200 MG tablet, Take 1 tablet (200 mg total) by mouth daily.   Vitamin D , Ergocalciferol , (DRISDOL ) 1.25 MG (50000 UNIT) CAPS capsule, Take 1 capsule (50,000 Units total) by mouth every 7 (seven) days.   Reviewed prior external information including notes and imaging from  primary care provider As well as notes that were available from care everywhere and other healthcare systems.  Past medical history, social, surgical and family history all reviewed in electronic medical record.  No pertanent information unless stated regarding to the chief complaint.   Review of Systems:  No headache, visual changes, nausea, vomiting, diarrhea, constipation, dizziness, abdominal pain, skin rash, fevers,  chills, night sweats, weight loss, swollen lymph nodes, body aches, joint swelling, chest pain, shortness of breath, mood changes. POSITIVE muscle aches  Objective  Blood pressure 110/86, pulse 77, height 5' 6 (1.676 m), weight 181 lb (82.1 kg), SpO2 98%.   General: No apparent distress alert and oriented x3 mood and affect normal, dressed appropriately.  HEENT: Pupils equal, extraocular movements intact  Respiratory: Patient's speak in full sentences and does not appear short of breath  Cardiovascular: No lower extremity edema, non tender, no erythema  Left shoulder does have some tenderness to palpation.  Some limited range of motion noted.  Right seems to be in all planes.  Rotator cuff tendons appear to be intact.  Procedure: Real-time Ultrasound Guided Injection of left AC joint  Device: GE Logiq Q7 Ultrasound guided injection is preferred based studies that show increased duration, increased effect, greater accuracy, decreased procedural pain, increased response rate, and decreased cost with ultrasound guided versus blind injection.  Verbal informed consent obtained.  Time-out conducted.  Noted no overlying erythema, induration, or other signs of local infection.  Skin prepped in a sterile fashion.  Local anesthesia: Topical Ethyl chloride.  With sterile technique and under real time ultrasound guidance: With a 25-gauge Needle injected with 0.5 cc of 0.5% Marcaine  and 0.5 cc of Kenalog  40 mg/mL Completed without difficulty  Pain immediately resolved suggesting accurate placement of the medication.  Advised to call if fevers/chills, erythema, induration, drainage, or persistent bleeding.  Images saved Impression: Technically successful ultrasound guided injection.  Procedure: Real-time Ultrasound Guided Injection of left glenohumeral joint Device: GE Logiq E  Ultrasound guided injection is preferred based studies that show increased duration, increased effect, greater accuracy,  decreased procedural pain, increased response rate with ultrasound guided versus blind injection.  Verbal informed consent obtained.  Time-out conducted.  Noted no overlying erythema, induration, or other signs of local infection.  Skin prepped in a sterile fashion.  Local anesthesia: Topical Ethyl chloride.  With sterile technique and under real time ultrasound guidance:  Joint visualized.  21g 2 inch needle inserted posterior approach. Pictures taken for needle placement. Patient did have injection of 2 cc of 0.5% Marcaine , and 1cc of Kenalog  40 mg/dL. Completed without difficulty  Pain immediately resolved suggesting accurate placement of the medication.  Advised to call if fevers/chills, erythema, induration, drainage, or persistent bleeding.  Images permanently stored  Impression: Technically successful ultrasound guided injection.    Impression and Recommendations:    The above documentation has been reviewed and is accurate and complete Jakari Sada M Kristinia Leavy, DO

## 2024-02-07 ENCOUNTER — Encounter: Payer: Self-pay | Admitting: Family Medicine

## 2024-02-07 ENCOUNTER — Ambulatory Visit: Admitting: Family Medicine

## 2024-02-07 ENCOUNTER — Other Ambulatory Visit: Payer: Self-pay

## 2024-02-07 VITALS — BP 110/86 | HR 77 | Ht 66.0 in | Wt 181.0 lb

## 2024-02-07 DIAGNOSIS — M19012 Primary osteoarthritis, left shoulder: Secondary | ICD-10-CM | POA: Diagnosis not present

## 2024-02-07 DIAGNOSIS — M25512 Pain in left shoulder: Secondary | ICD-10-CM

## 2024-02-07 NOTE — Assessment & Plan Note (Signed)
 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  Has had this previously.  Discussed with patient about icing regimen and home exercises, increase activity slowly.  Follow-up with me again in 2 months.

## 2024-02-07 NOTE — Assessment & Plan Note (Signed)
 Moderate overall, failed conservative therapy.  Injection given today, discussed with patient worsening symptoms would need advanced imaging.  Patient had improvement in range of motion and decrease in pain immediately.  Follow-up again with me in 2 to 3 months

## 2024-02-07 NOTE — Patient Instructions (Signed)
 Injection in shoulder today See you again in 2 months Do prescribed exercises at least 3x a week  Good to see you!

## 2024-04-08 NOTE — Progress Notes (Unsigned)
 [name] [address] [place] [address] Phone: [phone] Subjective:   Krista [name], am serving as a scribe for Dr. Arthea Krista.  I'm seeing this patient by the request  of:  Krista Reyes BIRCH, MD  CC: Left shoulder pain  YEP:Dlagzrupcz  02/07/2024 Moderate overall, failed conservative therapy.  Injection given today, discussed with patient worsening symptoms would need advanced imaging.  Patient had improvement in range of motion and decrease in pain immediately.  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  Has had this previously.  Discussed with patient about icing regimen and home exercises, increase activity slowly.  Follow-up with me again in 2 months.     Update 04/09/2024 Krista [name] is a 58 y.o. female coming in with complaint of L shoulder pain.  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  Given an injection in July.  Patient states has no shoulder pain. No other symptoms.      No past medical history on file. Past Surgical History:  Procedure Laterality Date   CLOSED MANIPULATION SHOULDER WITH STERIOD INJECTION Left 01/04/2021   Procedure: CLOSED MANIPULATION SHOULDER UNDER ANESTHESIA WITH STEROID INJECTION AND POSSIBLE ARTHROSCOPIC DEBRIDEMENT.;  Surgeon: Krista Norleen PARAS, MD;  Location: [facility] OR[facility];  Service: Orthopedics;  Laterality: Left;   GANGLION CYST EXCISION     NOSE SURGERY     TUBAL LIGATION     Social History   Socioeconomic History   Marital status: Married    Spouse name: Not on file   Number of children: Not on file   Years of education: Not on file   Highest education level: Not on file  Occupational History   Not on file  Tobacco Use   Smoking status: Never   Smokeless tobacco: Never  Vaping Use   Vaping status: Never Used  Substance and Sexual Activity   Alcohol use: Never   Drug use:  Never   Sexual activity: Not on file  Other Topics Concern   Not on file  Social History Narrative   Not on file   Social Drivers of Health   Financial Resource Strain: Low Risk  (04/03/2023)   Received from [facility]   Overall Financial Resource Strain (CARDIA)    Difficulty of Paying Living Expenses: Not hard at all  Food Insecurity: No Food Insecurity (04/03/2023)   Received from [facility]   Hunger Vital Sign    Within the past 12 months, you worried that your food would run out before you got the money to buy more.: Never true    Within the past 12 months, the food you bought just didn't last and you didn't have money to get more.: Never true  Transportation Needs: No Transportation Needs (04/03/2023)   Received from [facility] - Transportation    In the past 12 months, has lack of transportation kept you from medical appointments or from getting medications?: No    Lack of Transportation (Non-Medical): No  Physical Activity: Not on file  Stress: Not on file  Social Connections: Not on file   No Known Allergies No family history on file.       Current Outpatient Medications (Other):    COLLAGEN PO, Take 1 Scoop by mouth daily.   doxycycline  (VIBRA -TABS) 100 MG tablet, Take 1 tablet (  100 mg total) by mouth 2 (two) times daily.   fluconazole  (DIFLUCAN ) 200 MG tablet, Take 1 tablet (200 mg total) by mouth daily.   Vitamin D , Ergocalciferol , (DRISDOL ) 1.25 MG (50000 UNIT) CAPS capsule, Take 1 capsule (50,000 Units total) by mouth every 7 (seven) days.   Reviewed prior external information including notes and imaging from  primary care provider As well as notes that were available from care everywhere and other healthcare systems.  Past medical history, social, surgical and family history all reviewed in electronic medical record.  No pertanent information unless stated regarding to the chief complaint.    Review of Systems:  No headache, visual changes, nausea, vomiting, diarrhea, constipation, dizziness, abdominal pain, skin rash, fevers, chills, night sweats, weight loss, swollen lymph nodes, body aches, joint swelling, chest pain, shortness of breath, mood changes. POSITIVE muscle aches  Objective  Blood pressure 108/74, pulse 74, height 5' 6 (1.676 m), weight 173 lb (78.5 kg), SpO2 99%.   General: No apparent distress alert and oriented x3 mood and affect normal, dressed appropriately.  HEENT: Pupils equal, extraocular movements intact  Respiratory: Patient's speak in full sentences and does not appear short of breath  Cardiovascular: No lower extremity edema, non tender, no erythema  Left shoulder exam shows great range motion at this moment.  Nontender over the acromioclavicular joint.  Limited muscular skeletal ultrasound was performed and interpreted by Krista [name], M  Limited ultrasound shows great range of motion at the moment.  Nothing significant on findings today.    Impression and Recommendations:    The above documentation has been reviewed and is accurate and complete Krista [name] M Krista Rollings, DO

## 2024-04-09 ENCOUNTER — Ambulatory Visit: Admitting: Family Medicine

## 2024-04-09 VITALS — BP 108/74 | HR 74 | Ht 66.0 in | Wt 173.0 lb

## 2024-04-09 DIAGNOSIS — M19012 Primary osteoarthritis, left shoulder: Secondary | ICD-10-CM

## 2024-04-09 NOTE — Assessment & Plan Note (Signed)
 Responded extremely well to the injection previously.  Able to do all her activities.  Did discuss still keeping hands within peripheral vision.  Follow-up with me again as needed

## 2024-06-10 ENCOUNTER — Other Ambulatory Visit: Payer: Self-pay

## 2024-06-10 ENCOUNTER — Ambulatory Visit: Admitting: Family Medicine

## 2024-06-10 DIAGNOSIS — R109 Unspecified abdominal pain: Secondary | ICD-10-CM

## 2024-06-24 ENCOUNTER — Ambulatory Visit
Admission: RE | Admit: 2024-06-24 | Discharge: 2024-06-24 | Disposition: A | Discharge status: Home / Self Care | Source: Ambulatory Visit

## 2024-06-24 DIAGNOSIS — R109 Unspecified abdominal pain: Secondary | ICD-10-CM

## 2024-06-29 NOTE — Progress Notes (Unsigned)
 [name] [address] [place] [address] Phone: [phone] Subjective:   Krista [name], am serving as a scribe for Dr. Arthea Krista.  I'm seeing this patient by the request  of:  Krista Reyes BIRCH, MD  CC: left shoulder pain   YEP:Dlagzrupcz  04/09/2024 Responded extremely well to the injection previously.  Able to do all her activities.  Did discuss still keeping hands within peripheral vision.  Follow-up with me again as needed      Update 07/01/2024 Krista [name] is a 58 y.o. female coming in with complaint of L shoulder pain.  At last follow-up in September patient was doing significantly better.  Patient states that since November she has had L scapula pain. Injection in shoulder was helpful.        No past medical history on file. Past Surgical History:  Procedure Laterality Date   CLOSED MANIPULATION SHOULDER WITH STERIOD INJECTION Left 01/04/2021   Procedure: CLOSED MANIPULATION SHOULDER UNDER ANESTHESIA WITH STEROID INJECTION AND POSSIBLE ARTHROSCOPIC DEBRIDEMENT.;  Surgeon: Krista Norleen PARAS, MD;  Location: [facility] ORS;  Service: Orthopedics;  Laterality: Left;   GANGLION CYST EXCISION     NOSE SURGERY     TUBAL LIGATION     Social History   Socioeconomic History   Marital status: Married    Spouse name: Not on file   Number of children: Not on file   Years of education: Not on file   Highest education level: Not on file  Occupational History   Not on file  Tobacco Use   Smoking status: Never   Smokeless tobacco: Never  Vaping Use   Vaping status: Never Used  Substance and Sexual Activity   Alcohol use: Never   Drug use: Never   Sexual activity: Not on file  Other Topics Concern   Not on file  Social History Narrative   Not on file   Social Drivers of Health   Tobacco Use: Low Risk (02/07/2024)   Patient History    Smoking Tobacco Use: Never    Smokeless Tobacco Use: Never    Passive Exposure: Not on file   Financial Resource Strain: Low Risk  (04/03/2023)   Received from [facility]   Overall Financial Resource Strain (CARDIA)    Difficulty of Paying Living Expenses: Not hard at all  Food Insecurity: No Food Insecurity (04/03/2023)   Received from [facility]   Epic    Within the past 12 months, you worried that your food would run out before you got the money to buy more.: Never true    Within the past 12 months, the food you bought just didn't last and you didn't have money to get more.: Never true  Transportation Needs: No Transportation Needs (04/03/2023)   Received from [facility] - Transportation    In the past 12 months, has lack of transportation kept you from medical appointments or from getting medications?: No    Lack of Transportation (Non-Medical): No  Physical Activity: Not on file  Stress: Not on file  Social Connections: Not on file  Depression (EYV7-0): Not on file  Alcohol Screen: Not on file  Housing: Low Risk  (04/03/2023)   Received from [facility]   Epic    In the last 12 months, was there a time when you were not able to pay the mortgage or rent on time?: No  In the past 12 months, how many times have you moved where you were living?: 0    At any time in the past 12 months, were you homeless or living in a shelter (including now)?: No  Utilities: Not At Risk (04/03/2023)   Received from [facility]    Threatened with loss of utilities: No  Health Literacy: Not on file   Allergies[1] No family history on file.  Current Outpatient Medications (Other):    COLLAGEN PO, Take 1 Scoop by mouth daily.   doxycycline  (VIBRA -TABS) 100 MG tablet, Take 1 tablet (100 mg total) by mouth 2 (two) times daily.   fluconazole  (DIFLUCAN ) 200 MG tablet, Take 1 tablet (200 mg total) by mouth daily.   Vitamin D , Ergocalciferol , (DRISDOL ) 1.25 MG (50000 UNIT) CAPS  capsule, Take 1 capsule (50,000 Units total) by mouth every 7 (seven) days.   Reviewed prior external information including notes and imaging from  primary care provider As well as notes that were available from care everywhere and other healthcare systems.  Past medical history, social, surgical and family history all reviewed in electronic medical record.  No pertanent information unless stated regarding to the chief complaint.   Review of Systems:  No headache, visual changes, nausea, vomiting, diarrhea, constipation, dizziness, abdominal pain, skin rash, fevers, chills, night sweats, weight loss, swollen lymph nodes, body aches, joint swelling, chest pain, shortness of breath, mood changes. POSITIVE muscle aches  Objective  Blood pressure 102/74, pulse 68, height 5' 6 (1.676 m), weight 166 lb (75.3 kg), SpO2 95%.   General: No apparent distress alert and oriented x3 mood and affect normal, dressed appropriately.  HEENT: Pupils equal, extraocular movements intact  Respiratory: Patient's speak in full sentences and does not appear short of breath  Cardiovascular: No lower extremity edema, non tender, no erythema  Left shoulder exam shows tightness in the scapular dyskinesis area.  Neck exam shows tightness noted.  Limited sidebending to the left and rotation to the right  Limited muscular skeletal ultrasound was performed and interpreted by Krista [name], M  No images saved  Osteopathic findings C2 flexed rotated and side bent right C4 flexed rotated and side bent left C6 flexed rotated and side bent left T3 extended rotated and side bent left inhaled third rib T5 extended rotated and side bent left inhaled rib L2 flexed rotated and side bent right Sacrum right on right    Impression and Recommendations:     The above documentation has been reviewed and is accurate and complete Krista Willette M Katelynn Heidler, DO       [1] No Known Allergies

## 2024-07-01 ENCOUNTER — Ambulatory Visit: Admitting: Family Medicine

## 2024-07-01 ENCOUNTER — Other Ambulatory Visit: Payer: Self-pay

## 2024-07-01 ENCOUNTER — Encounter: Payer: Self-pay | Admitting: Family Medicine

## 2024-07-01 VITALS — BP 102/74 | HR 68 | Ht 66.0 in | Wt 166.0 lb

## 2024-07-01 DIAGNOSIS — M51362 Other intervertebral disc degeneration, lumbar region with discogenic back pain and lower extremity pain: Secondary | ICD-10-CM

## 2024-07-01 DIAGNOSIS — M9908 Segmental and somatic dysfunction of rib cage: Secondary | ICD-10-CM | POA: Diagnosis not present

## 2024-07-01 DIAGNOSIS — M25512 Pain in left shoulder: Secondary | ICD-10-CM

## 2024-07-01 DIAGNOSIS — G8929 Other chronic pain: Secondary | ICD-10-CM | POA: Diagnosis not present

## 2024-07-01 DIAGNOSIS — M9902 Segmental and somatic dysfunction of thoracic region: Secondary | ICD-10-CM | POA: Diagnosis not present

## 2024-07-01 DIAGNOSIS — M9901 Segmental and somatic dysfunction of cervical region: Secondary | ICD-10-CM | POA: Diagnosis not present

## 2024-07-01 DIAGNOSIS — M9904 Segmental and somatic dysfunction of sacral region: Secondary | ICD-10-CM | POA: Diagnosis not present

## 2024-07-01 DIAGNOSIS — M9903 Segmental and somatic dysfunction of lumbar region: Secondary | ICD-10-CM

## 2024-07-01 NOTE — Assessment & Plan Note (Signed)
 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  Discussed icing regimen and home exercises, discussed which activities to do and which ones to avoid.  Increase activity slowly.  Follow-up with me again in 6 to 8 weeks

## 2024-07-01 NOTE — Assessment & Plan Note (Signed)
 Significant improvement and having no significant discomfort at the moment.  Seems to be more some scapular dyskinesis on the left side.  Follow-up with me again in 6 to 8 weeks otherwise.

## 2024-09-02 ENCOUNTER — Ambulatory Visit: Admitting: Family Medicine
